# Patient Record
Sex: Male | Born: 1935
Health system: Southern US, Community
[De-identification: ages and names within clinical notes are randomized; demographics above are authoritative.]

## PROBLEM LIST (undated history)

## (undated) DIAGNOSIS — R918 Other nonspecific abnormal finding of lung field: Secondary | ICD-10-CM

## (undated) DIAGNOSIS — Z950 Presence of cardiac pacemaker: Secondary | ICD-10-CM

## (undated) DIAGNOSIS — C801 Malignant (primary) neoplasm, unspecified: Secondary | ICD-10-CM

## (undated) DIAGNOSIS — I441 Atrioventricular block, second degree: Secondary | ICD-10-CM

## (undated) DIAGNOSIS — I10 Essential (primary) hypertension: Secondary | ICD-10-CM

## (undated) DIAGNOSIS — R51 Headache: Secondary | ICD-10-CM

## (undated) HISTORY — PX: JOINT REPLACEMENT: SHX530

## (undated) HISTORY — DX: Other nonspecific abnormal finding of lung field: R91.8

## (undated) HISTORY — PX: HERNIA REPAIR: SHX51

## (undated) HISTORY — DX: Atrioventricular block, second degree: I44.1

## (undated) HISTORY — DX: Presence of cardiac pacemaker: Z95.0

---

## 2003-10-30 ENCOUNTER — Other Ambulatory Visit: Payer: Self-pay

## 2004-03-01 ENCOUNTER — Ambulatory Visit: Payer: Self-pay | Admitting: General Surgery

## 2005-02-14 HISTORY — PX: PROSTATECTOMY: SHX69

## 2006-06-08 ENCOUNTER — Ambulatory Visit: Payer: Self-pay | Admitting: Urology

## 2006-08-11 ENCOUNTER — Ambulatory Visit: Payer: Self-pay | Admitting: Urology

## 2006-08-15 ENCOUNTER — Ambulatory Visit: Payer: Self-pay | Admitting: Urology

## 2006-09-05 ENCOUNTER — Other Ambulatory Visit: Payer: Self-pay

## 2006-09-05 ENCOUNTER — Ambulatory Visit: Payer: Self-pay | Admitting: Urology

## 2006-09-19 ENCOUNTER — Inpatient Hospital Stay: Payer: Self-pay | Admitting: Urology

## 2007-02-15 HISTORY — PX: LEG AMPUTATION ABOVE KNEE: SHX117

## 2007-08-30 ENCOUNTER — Ambulatory Visit: Payer: Self-pay | Admitting: General Practice

## 2007-08-30 ENCOUNTER — Other Ambulatory Visit: Payer: Self-pay

## 2007-09-12 ENCOUNTER — Inpatient Hospital Stay: Payer: Self-pay | Admitting: General Practice

## 2007-09-17 ENCOUNTER — Other Ambulatory Visit: Payer: Self-pay

## 2007-09-21 ENCOUNTER — Encounter: Payer: Self-pay | Admitting: Internal Medicine

## 2007-09-29 ENCOUNTER — Inpatient Hospital Stay: Payer: Self-pay | Admitting: Vascular Surgery

## 2007-10-17 ENCOUNTER — Encounter: Payer: Self-pay | Admitting: Internal Medicine

## 2007-10-31 ENCOUNTER — Encounter: Payer: Self-pay | Admitting: Internal Medicine

## 2007-11-15 ENCOUNTER — Encounter: Payer: Self-pay | Admitting: Internal Medicine

## 2007-11-23 ENCOUNTER — Ambulatory Visit: Payer: Self-pay | Admitting: Podiatry

## 2007-12-04 ENCOUNTER — Ambulatory Visit: Payer: Self-pay | Admitting: Podiatry

## 2007-12-07 ENCOUNTER — Emergency Department: Payer: Self-pay | Admitting: Emergency Medicine

## 2007-12-13 ENCOUNTER — Inpatient Hospital Stay: Payer: Self-pay | Admitting: Vascular Surgery

## 2007-12-13 ENCOUNTER — Emergency Department: Payer: Self-pay | Admitting: Emergency Medicine

## 2008-01-10 ENCOUNTER — Encounter: Payer: Self-pay | Admitting: Internal Medicine

## 2008-01-18 ENCOUNTER — Ambulatory Visit: Payer: Self-pay | Admitting: Vascular Surgery

## 2008-01-30 ENCOUNTER — Inpatient Hospital Stay: Payer: Self-pay | Admitting: Vascular Surgery

## 2008-02-18 ENCOUNTER — Inpatient Hospital Stay: Payer: Self-pay | Admitting: Vascular Surgery

## 2008-02-18 ENCOUNTER — Ambulatory Visit: Payer: Self-pay | Admitting: Cardiovascular Disease

## 2008-02-22 ENCOUNTER — Ambulatory Visit: Payer: Self-pay | Admitting: Physical Medicine & Rehabilitation

## 2008-02-22 ENCOUNTER — Inpatient Hospital Stay (HOSPITAL_COMMUNITY)
Admission: RE | Admit: 2008-02-22 | Discharge: 2008-02-29 | Payer: Self-pay | Admitting: Physical Medicine & Rehabilitation

## 2008-03-27 ENCOUNTER — Encounter: Payer: Self-pay | Admitting: Vascular Surgery

## 2008-04-14 ENCOUNTER — Encounter: Payer: Self-pay | Admitting: Internal Medicine

## 2008-04-22 ENCOUNTER — Encounter
Admission: RE | Admit: 2008-04-22 | Discharge: 2008-05-09 | Payer: Self-pay | Admitting: Physical Medicine & Rehabilitation

## 2008-04-23 ENCOUNTER — Ambulatory Visit: Payer: Self-pay | Admitting: Physical Medicine & Rehabilitation

## 2008-04-23 ENCOUNTER — Encounter
Admission: RE | Admit: 2008-04-23 | Discharge: 2008-07-22 | Payer: Self-pay | Admitting: Physical Medicine & Rehabilitation

## 2008-05-15 ENCOUNTER — Encounter: Payer: Self-pay | Admitting: Internal Medicine

## 2008-06-14 ENCOUNTER — Encounter: Payer: Self-pay | Admitting: Internal Medicine

## 2008-07-15 ENCOUNTER — Encounter: Payer: Self-pay | Admitting: Internal Medicine

## 2008-08-14 ENCOUNTER — Encounter: Payer: Self-pay | Admitting: Internal Medicine

## 2008-09-14 ENCOUNTER — Encounter: Payer: Self-pay | Admitting: Internal Medicine

## 2008-10-15 ENCOUNTER — Encounter: Payer: Self-pay | Admitting: Internal Medicine

## 2008-11-14 ENCOUNTER — Encounter: Payer: Self-pay | Admitting: Internal Medicine

## 2008-12-15 ENCOUNTER — Encounter: Payer: Self-pay | Admitting: Internal Medicine

## 2009-02-19 ENCOUNTER — Ambulatory Visit: Payer: Self-pay

## 2009-03-20 ENCOUNTER — Encounter: Payer: Self-pay | Admitting: General Practice

## 2009-04-14 ENCOUNTER — Encounter: Payer: Self-pay | Admitting: General Practice

## 2009-05-15 ENCOUNTER — Encounter: Payer: Self-pay | Admitting: General Practice

## 2010-05-31 LAB — DIFFERENTIAL
Basophils Absolute: 0.1 10*3/uL (ref 0.0–0.1)
Eosinophils Relative: 5 % (ref 0–5)
Lymphocytes Relative: 19 % (ref 12–46)
Lymphs Abs: 1.3 10*3/uL (ref 0.7–4.0)
Neutro Abs: 4.4 10*3/uL (ref 1.7–7.7)

## 2010-05-31 LAB — COMPREHENSIVE METABOLIC PANEL
AST: 18 U/L (ref 0–37)
Albumin: 2.6 g/dL — ABNORMAL LOW (ref 3.5–5.2)
BUN: 12 mg/dL (ref 6–23)
Calcium: 9.3 mg/dL (ref 8.4–10.5)
Creatinine, Ser: 0.85 mg/dL (ref 0.4–1.5)
GFR calc Af Amer: >60 mL/min (ref >60)
GFR calc non Af Amer: >60 mL/min (ref >60)
Total Bilirubin: 0.6 mg/dL (ref 0.3–1.2)

## 2010-05-31 LAB — CBC
HCT: 30 % — ABNORMAL LOW (ref 39.0–52.0)
MCHC: 32.9 g/dL (ref 30.0–36.0)
MCV: 82.2 fL (ref 78.0–100.0)
Platelets: 499 10*3/uL — ABNORMAL HIGH (ref 150–400)

## 2010-06-29 NOTE — Assessment & Plan Note (Signed)
[name] is here after his right above-knee amputation.  He was  discharged home on February 29, 2008 from rehab and really has been  moving well.  He is not using any wheelchair at this point and amazingly  he is walking around with his walker, using the left leg, which  previously had a knee replacement.  He denies any problems with pain.  He is moving well and has been involved in outpatient therapies.  He has  had no problems with wound healing.  Left leg is holding up as well.  The patient wants to return to guarding and other activities outside the  home and inside the home.  It is little bit limited overall on his  activities in general due to his wife's condition.   REVIEW OF SYSTEMS:  Notable for the above.  Full review is in the  written health and history section.   SOCIAL HISTORY:  As noted above.  The patient is married.   PHYSICAL EXAMINATION:  VITAL SIGNS:  Blood pressure is 147/84, pulse is  91, respiratory rate 16, and sating 98% on room air.  GENERAL:  The patient is only pleasant, alert, and oriented x3.  He  comes in without a wheelchair today.  He stands unassisted with his  walker and actually he could stand on 1 leg without any balance at all.  He is able to dawn and doff his pants without assistance.  EXTREMITIES:  Right leg is well formed with stump shrinker in place.  The left leg has  5/5 strength.  Right leg has 4+-5/5 at the hip.  He has +10 hip  extension.  Upper extremity strength is 5/5.  HEART:  Regular.  CHEST:  Clear.  NEUROLOGIC:  Cognitively, he is appropriate.   ASSESSMENT:  1. Right above-knee amputation.  2. The patient has a K3 ambulator.   PLAN:  1. Review prosthetic options and felt that he would benefit from a      silicone suspension system with D-ring Velcro strap for rotation      prevention.  I will use a polycentric knee and a low profile      VariFlex foot to allow him weightbearing on uneven surfaces such as      his garden.  He  should do extremely well.  We will send to [facility] outpatient therapy once he is fitted with his leg by      [employer].      Ranelle Oyster, M.D.  Electronically Signed     ZTS/MedQ  D:  04/23/2008 11:49:14  T:  04/23/2008 23:55:16  Job #:  [id number]

## 2010-06-29 NOTE — Discharge Summary (Signed)
[name], [name]                   ACCOUNT NO.:  [id number]   MEDICAL RECORD NO.:  [id number]          PATIENT TYPE:  IPS   LOCATION:  4035                         FACILITY:  [facility]   PHYSICIAN:  Ellwood Dense, M.D.   DATE OF BIRTH:  1930-10-13   DATE OF ADMISSION:  02/22/2008  DATE OF DISCHARGE:  02/29/2008                               DISCHARGE SUMMARY   DISCHARGE DIAGNOSES:  1. Right above-knee amputation secondary to peripheral vascular      disease February 28, 2008.  2. Pain management.  3. Hypertension.  4. Hyperlipidemia.  5. Depression.  6. History of prostate cancer.  7. Anemia.  8. Subcutaneous Lovenox for deep vein thrombosis prophylaxis.   This is a 77 year old male, long history of peripheral vascular disease,  multi-revascularization procedures right lower extremity, who was  admitted to [facility] on February 18, 2008, with  ischemic changes to the right lower extremity and foul odor from  nonhealing ulcer.  No change with conservative care and underwent a  recent course of antibiotics with little change.  Underwent right above-  knee amputation on February 18, 2008, per Dr. Earl Lites at [facility]  Postoperative pain control with oxycodone.  Subcutaneous Lovenox for deep vein thrombosis prophylaxis.  Therapy  evaluation February 29, 2008, was max assist for supine to sit and  minimal assist sit to supine.  Gait was not tested.  The patient was  admitted for comprehensive rehab program.   PAST MEDICAL HISTORY:  See discharge diagnoses.  Remote smoker.  No  alcohol.   ALLERGIES:  ACE INHIBITORS and STATINS.   SOCIAL HISTORY:  Lives with wife.  Two-level home, 2 steps to entry.  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  Wife can also assist.   FUNCTIONAL HISTORY:  Prior to admission, he was independent with a  rolling walker.  Functional status  upon admission to rehab services, he  was minimal assist for basic mobility.   MEDICATIONS PRIOR TO ADMISSION:  1. Atenolol 25 mg daily.  2. Imipramine 25 mg 2 tablets daily.  3. Milk of magnesia as needed.   PHYSICAL EXAMINATION:  VITAL SIGNS:  Blood pressure 115/60, pulse 80,  temperature 98, and respirations 18.  GENERAL:  This is an alert male in no acute distress, oriented x3.  EXTREMITIES:  Deep tendon reflexes were hypoactive.  Sensation decreased  to light touch distally.  Mild redness with clips to right above-knee  amputation site.  No drainage.  Left calf was cool without any swelling,  erythema, nontender.  LUNGS:  Clear to auscultation.  CARDIAC:  Regular rate and rhythm.  ABDOMEN:  Soft and nontender.  Good bowel sounds.   REHABILITATION HOSPITAL COURSE:  The patient was admitted to inpatient  rehab services with therapies initiated on a 3-hour daily basis  consisting of physical therapy, occupational therapy, and 24-hour  rehabilitation nursing.  The following issues were addressed during the  patient's rehabilitation stay.  Pertaining to Mr. [name]  right above-  knee amputation secondary to peripheral vascular disease on February 18, 2008, surgical site healing nicely.  He had been fitted with a stump  shrinker per [employer].  He would follow up with Dr. Earl Lites at [facility] in the next 2 weeks for removal of staples.  A  plan was made at the [facility].  Pain  management ongoing with the use of oxycodone as needed for good results.  He remained on subcutaneous Lovenox for deep vein thrombosis prophylaxis  throughout his rehab course.  Blood pressures were well maintained on  atenolol with no orthostatic changes.  He remained on his imipramine for  history of depression with emotional support provided.  He did have a  history of prostate cancer.  There was no issues during his  rehabilitation stay and pertaining to his  bladder, he was voiding  without difficulties.  His bowel program had been well established.   Weekly interdisciplinary collaborative team conferences were held to  discuss the patient's estimated length of stays, ongoing family  teaching, any barriers to discharge.  He was attending full therapies,  independent for activities of daily living, sitting at sink side, steady  assistance to stand, needed some assistance for lower body self-care.  He was discharged to home with home therapies as advised per rehab  services.   DISCHARGE MEDICATIONS:  Discharge medications at the time of dictation  included:  1. Multivitamin daily.  2. Trinsicon 1 capsule twice daily.  3. Os-Cal 500 mg twice daily.  4. Atenolol 25 mg daily.  5. Lipitor 10 mg daily.  6. Imipramine 50 mg p.o. at bedtime.  7. Oxycodone immediate release 5 mg 1 or 2 tablets every 4 hours as      needed pain, dispense of 90 tablets.   The patient's subcutaneous Lovenox was discontinued at time of  discharge.  Diet was regular.   SPECIAL INSTRUCTIONS:  Follow up with Dr. Earl Lites at [facility] for removal of staples in the next 2 weeks.  Continue  stump shrinker as advised.  Dr. Thomasena Edis is to follow up in the  [facility]patient [facility], appointment to be made.  Follow up Dewaine Oats  ongoing medical management.      Mariam Dollar, P.A.    ______________________________  Ellwood Dense, M.D.   DA/MEDQ  D:  02/28/2008  T:  02/28/2008  Job:  [id number]   cc:   Dewaine Oats  Dr. Earl Lites

## 2010-06-29 NOTE — H&P (Signed)
NAMERENTON, BERKLEY                   ACCOUNT NO.:  [id number]   MEDICAL RECORD NO.:  [id number]          PATIENT TYPE:  IPS   LOCATION:  4035                         FACILITY:  [facility]   PHYSICIAN:  Ellwood Dense, M.D.   DATE OF BIRTH:  [birth year]   DATE OF ADMISSION:  02/22/2008  DATE OF DISCHARGE:                              HISTORY & PHYSICAL   PRIMARY CARE PHYSICIAN:  Dr. Arlana Pouch.   VASCULAR SURGEON:  Dr. Danne Harbor. Earl Lites.   HISTORY OF PRESENT ILLNESS:  Mr. Wetherington is a 77 year old Caucasian male  with a longstanding history of peripheral vascular disease with multiple  revascularization procedures, the most recent in September and October,  2009 on his right lower extremity.  The patient also has a history of a  right total knee replacement in July, 2009.  Which healed well  postoperatively.   The patient was admitted to [facility] on February 18, 2008 with ischemic changes in the right lower extremity with foul  odor coming from a nonhealing ulcer.  He had failed conservative care  including recent course of antibiotics.   The patient underwent a right above-knee amputation, February 18, 2008 by  Dr. Earl Lites Dr. Mayford Knife.  Postoperative pain control was with oxycodone  on an as-needed basis.  Subcutaneous Lovenox was added for DVT  prophylaxis.  Therapy eval February 19, 2008 showed the patient requiring  max assist for supine to sit and min assist sit to supine.  Gait was not  tested.   The patient was evaluated by the rehabilitation physicians and felt to  be an appropriate candidate for inpatient rehabilitation.   REVIEW OF SYSTEMS:  Positive for depression, reflux and weakness.   PAST MEDICAL HISTORY:  1. History of peripheral vascular disease with multiple      revascularization procedures.  2. Dyslipidemia.  3. Hypertension.  4. History of prostate cancer, status post radical prostatectomy 7      years, probably greater than 1 year ago.  5.  Depression.  6. Constipation.  7. Right total knee replacement, July of 2009.  8. Left total knee replacement 6 years ago.   FAMILY HISTORY:  Positive for coronary artery disease.   SOCIAL HISTORY:  The patient lives with his wife and an adult daughter  in her 40s who has suffered brain injury as a result of an accident in  her 39s.  She does need some assistance at home.  The the patient's wife  has also had some cardiac medical problems in the past.  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  His wife can reportedly give some assist at  discharge.   FUNCTIONAL HISTORY PRIOR TO ADMISSION:  Independent using a rolling  walker and completely independent prior to July of 2009.   FUNCTIONAL STATUS AT PRESENT:  Min assist overall.   ALLERGIES:  ACE INHIBITORS AND STATINS.   MEDICATIONS PRIOR TO ADMISSION:  1. Lipitor 10 mg daily.  2. Atenolol 25 mg daily.  3. Milk of  magnesia p.r.n.  4. Imipramine 25 mg two tablets daily   LABORATORY:  Recent hemoglobin was 9.1 with hematocrit of 27, platelet  count of 370,000, white count of 6.7.  They are no electrolytes noted.   PHYSICAL EXAMINATION:  A well-appearing elderly adult male lying in bed  in mild to no acute discomfort.  Blood pressure 115/60 with pulse of 80, respiratory rate 18 and  temperature 98.  HEENT:  Normocephalic, nontraumatic artifacts was  CARDIOVASCULAR:  Regular rate and rhythm.  S1 and S2 without murmurs.  ABDOMEN:  Soft, slightly obese, nontender with positive bowel sounds.  LUNGS:  Clear to auscultation bilaterally.  NEUROLOGIC:  Alert and oriented x3.  Cranial nerves II-XII are intact.  BILATERAL UPPER EXTREMITY EXAM:  Showed 4/5 strength throughout.  Bulk  and tone were normal and reflexes 2+ and symmetrical.  Sensation was  intact to light touch throughout the bilateral upper extremities.  EXAMINATION OF HIS ABOVE-KNEE AMPUTATION SITE:  Shows  excellent healing  without drainage.  Staples are intact.  He has a Kerlix along with Ace  wrap on that incision/leg.  He has hip flexion of 4-/5 on the right.  LEFT LOWER EXTREMITY EXAM:  Showed normal bulk and tone throughout.  There were no open areas.  No areas of wounds.  Strength was generally  4/5 in the left lower extremity.   DIAGNOSES:  Status post right above-knee amputation secondary to chronic  peripheral vascular disease with history of multiple revascularization  procedures in the past, along with partial foot amputation.   1. Presently the patient is admitted to receive collaborative and      interdisciplinary care between the physiatrist, rehab nursing staff      and therapy team.  2. The patient's level of medical complexity and substantial therapy      needs in context of that medical necessity cannot be provided at      lesser intensity of care.  The patient has experienced substantial      functional loss from his baseline.  Judging by the patient's      diagnosis, physical exam and functional history it is hoped with      aggressive therapies that the patient will be able to make      measurable improvements in functional status to be able to return      home with his wife.  The physiatrist will provide 24-hour      management of medical needs as well as oversight of the therapy      plan/treatment and provide guidance as appropriate regarding      interaction of the two.  3. A 24-hour rehab nursing will assist in management of bowel and      bladder and help integrate therapy concepts, techniques and      education.  4. Physical therapy will assess and treat for range of motion,      strengthening, bed mobility, transfers, pregait training, gait      training and equipment eval with goals of modified independent      transfers and modified independent wheelchair mobility with standby      assist for ambulation short distances.  5. Occupational therapy will  assess and treat for range of motion,      strengthening, ADLs, cognitive/perceptional training, splinting and      equipment eval with goals of modified independent upper extremity      ADLs and standby assist lower extremity ADLs.  6. The case management  and social worker will assess and treat for      psychosocial issues and discharge planning as appropriate.  Team      conferences will be held weekly to establish goals, assess progress      and determine barriers to discharge.  The patient has demonstrated      sufficient medical stability and exercise capacity to tolerate at      least 3 hours of therapy per day at least 5 days per week.  7. Estimated length of stay:  10-18 days.   PROGNOSIS:  Good.   MEDICAL PROBLEM LIST:  1. Hypertension, presently maintained on atenolol.  2. Dyslipidemia with continuation of Lipitor.  3. Depression with continuation of imipramine.  4. Postoperative anemia with continuation of iron.  5. Subcutaneous Lovenox for DVT prophylaxis.  6. Pain control with p.r.n. oxycodone immediate release.  7. Referral to [employer] for future prosthesis.           ______________________________  Ellwood Dense, M.D.     DC/MEDQ  D:  02/22/2008  T:  02/22/2008  Job:  [id number]

## 2011-12-16 HISTORY — PX: INSERT / REPLACE / REMOVE PACEMAKER: SUR710

## 2011-12-20 ENCOUNTER — Encounter (HOSPITAL_COMMUNITY): Payer: Self-pay | Admitting: *Deleted

## 2011-12-20 ENCOUNTER — Inpatient Hospital Stay (HOSPITAL_COMMUNITY)
Admission: AD | Admit: 2011-12-20 | Discharge: 2011-12-23 | DRG: 243 | Disposition: A | Discharge status: Skilled Nursing Facility | Payer: Medicare Other | Source: Other Acute Inpatient Hospital | Attending: Internal Medicine | Admitting: Internal Medicine

## 2011-12-20 ENCOUNTER — Emergency Department: Payer: Self-pay | Admitting: Emergency Medicine

## 2011-12-20 DIAGNOSIS — I4430 Unspecified atrioventricular block: Secondary | ICD-10-CM

## 2011-12-20 DIAGNOSIS — Z8546 Personal history of malignant neoplasm of prostate: Secondary | ICD-10-CM

## 2011-12-20 DIAGNOSIS — K8020 Calculus of gallbladder without cholecystitis without obstruction: Secondary | ICD-10-CM | POA: Diagnosis present

## 2011-12-20 DIAGNOSIS — S78119A Complete traumatic amputation at level between unspecified hip and knee, initial encounter: Secondary | ICD-10-CM

## 2011-12-20 DIAGNOSIS — Z7982 Long term (current) use of aspirin: Secondary | ICD-10-CM

## 2011-12-20 DIAGNOSIS — Z87891 Personal history of nicotine dependence: Secondary | ICD-10-CM

## 2011-12-20 DIAGNOSIS — I2510 Atherosclerotic heart disease of native coronary artery without angina pectoris: Secondary | ICD-10-CM | POA: Diagnosis present

## 2011-12-20 DIAGNOSIS — I441 Atrioventricular block, second degree: Principal | ICD-10-CM | POA: Diagnosis present

## 2011-12-20 DIAGNOSIS — I442 Atrioventricular block, complete: Secondary | ICD-10-CM

## 2011-12-20 DIAGNOSIS — Z96659 Presence of unspecified artificial knee joint: Secondary | ICD-10-CM

## 2011-12-20 DIAGNOSIS — Z79899 Other long term (current) drug therapy: Secondary | ICD-10-CM

## 2011-12-20 DIAGNOSIS — I10 Essential (primary) hypertension: Secondary | ICD-10-CM

## 2011-12-20 DIAGNOSIS — R911 Solitary pulmonary nodule: Secondary | ICD-10-CM | POA: Diagnosis present

## 2011-12-20 DIAGNOSIS — Z23 Encounter for immunization: Secondary | ICD-10-CM

## 2011-12-20 DIAGNOSIS — R55 Syncope and collapse: Secondary | ICD-10-CM

## 2011-12-20 DIAGNOSIS — E785 Hyperlipidemia, unspecified: Secondary | ICD-10-CM | POA: Diagnosis present

## 2011-12-20 DIAGNOSIS — Z8614 Personal history of Methicillin resistant Staphylococcus aureus infection: Secondary | ICD-10-CM

## 2011-12-20 DIAGNOSIS — R918 Other nonspecific abnormal finding of lung field: Secondary | ICD-10-CM

## 2011-12-20 HISTORY — DX: Malignant (primary) neoplasm, unspecified: C80.1

## 2011-12-20 HISTORY — DX: Headache: R51

## 2011-12-20 HISTORY — DX: Essential (primary) hypertension: I10

## 2011-12-20 LAB — CBC
HCT: 47.9 % (ref 40.0–52.0)
HCT: 45.2 % (ref 39.0–52.0)
Hemoglobin: 15.6 g/dL (ref 13.0–17.0)
MCH: 33 pg (ref 26.0–34.0)
MCHC: 34.5 g/dL (ref 30.0–36.0)
RBC: 4.87 MIL/uL (ref 4.22–5.81)
RDW: 13.1 % (ref 11.5–14.5)
WBC: 8.2 10*3/uL (ref 3.8–10.6)

## 2011-12-20 LAB — TROPONIN I: Troponin-I: <0.02 ng/mL

## 2011-12-20 LAB — URINALYSIS, COMPLETE
Bilirubin,UR: NEGATIVE
Glucose,UR: NEGATIVE mg/dL (ref 0–75)
Hyaline Cast: 7
Ketone: NEGATIVE
RBC,UR: 1 /HPF (ref 0–5)
Squamous Epithelial: NONE SEEN
WBC UR: 1 /HPF (ref 0–5)

## 2011-12-20 LAB — COMPREHENSIVE METABOLIC PANEL
BUN: 18 mg/dL (ref 7–18)
Calcium, Total: 9.5 mg/dL (ref 8.5–10.1)
Chloride: 105 mmol/L (ref 98–107)
Co2: 28 mmol/L (ref 21–32)
Creatinine: 0.98 mg/dL (ref 0.60–1.30)
EGFR (African American): >60
EGFR (Non-African Amer.): >60
Osmolality: 284 (ref 275–301)
SGOT(AST): 16 U/L (ref 15–37)
SGPT (ALT): 33 U/L (ref 12–78)
Total Protein: 7.3 g/dL (ref 6.4–8.2)

## 2011-12-20 LAB — MAGNESIUM: Magnesium: 2.2 mg/dL

## 2011-12-20 LAB — PROTIME-INR: Prothrombin Time: 13.6 seconds (ref 11.6–15.2)

## 2011-12-20 MED ORDER — ATORVASTATIN CALCIUM 20 MG PO TABS
20.0000 mg | ORAL_TABLET | Freq: Every evening | ORAL | Status: DC
  Administered 2011-12-22: 20 mg via ORAL
  Filled 2011-12-20 (×4): qty 1

## 2011-12-20 MED ORDER — SODIUM CHLORIDE 0.9 % IJ SOLN
3.0000 mL | Freq: Two times a day (BID) | INTRAMUSCULAR | Status: DC
  Administered 2011-12-20 – 2011-12-21 (×2): 3 mL via INTRAVENOUS

## 2011-12-20 MED ORDER — SODIUM CHLORIDE 0.9 % IV SOLN: 250.0000 mL | INTRAVENOUS | Status: DC | PRN

## 2011-12-20 MED ORDER — ASPIRIN EC 81 MG PO TBEC
81.0000 mg | DELAYED_RELEASE_TABLET | Freq: Every day | ORAL | Status: DC
  Administered 2011-12-21 – 2011-12-23 (×3): 81 mg via ORAL
  Filled 2011-12-20 (×3): qty 1

## 2011-12-20 MED ORDER — HEPARIN SODIUM (PORCINE) 5000 UNIT/ML IJ SOLN
5000.0000 [IU] | Freq: Three times a day (TID) | INTRAMUSCULAR | Status: DC
  Administered 2011-12-20 – 2011-12-21 (×2): 5000 [IU] via SUBCUTANEOUS
  Filled 2011-12-20 (×5): qty 1

## 2011-12-20 MED ORDER — IMIPRAMINE HCL 50 MG PO TABS
50.0000 mg | ORAL_TABLET | Freq: Every morning | ORAL | Status: DC
  Administered 2011-12-21 – 2011-12-23 (×3): 50 mg via ORAL
  Filled 2011-12-20 (×3): qty 1

## 2011-12-20 MED ORDER — SODIUM CHLORIDE 0.9 % IJ SOLN: 3.0000 mL | INTRAMUSCULAR | Status: DC | PRN
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
## 2011-12-21 ENCOUNTER — Inpatient Hospital Stay (HOSPITAL_COMMUNITY): Payer: Medicare Other

## 2011-12-21 ENCOUNTER — Encounter (HOSPITAL_COMMUNITY)
Admission: AD | Disposition: A | Discharge status: Skilled Nursing Facility | Payer: Self-pay | Source: Other Acute Inpatient Hospital | Attending: Internal Medicine

## 2011-12-21 DIAGNOSIS — I441 Atrioventricular block, second degree: Secondary | ICD-10-CM

## 2011-12-21 DIAGNOSIS — R55 Syncope and collapse: Secondary | ICD-10-CM

## 2011-12-21 DIAGNOSIS — I442 Atrioventricular block, complete: Secondary | ICD-10-CM

## 2011-12-21 HISTORY — PX: PERMANENT PACEMAKER INSERTION: SHX5480

## 2011-12-21 HISTORY — DX: Atrioventricular block, second degree: I44.1

## 2011-12-21 LAB — COMPREHENSIVE METABOLIC PANEL
ALT: 20 U/L (ref 0–53)
Alkaline Phosphatase: 49 U/L (ref 39–117)
BUN: 16 mg/dL (ref 6–23)
CO2: 24 mEq/L (ref 19–32)
Chloride: 105 mEq/L (ref 96–112)
GFR calc Af Amer: >90 mL/min (ref >90)
GFR calc non Af Amer: 81 mL/min — ABNORMAL LOW (ref >90)
Glucose, Bld: 103 mg/dL — ABNORMAL HIGH (ref 70–99)
Potassium: 3.8 mEq/L (ref 3.5–5.1)
Sodium: 140 mEq/L (ref 135–145)
Total Bilirubin: 0.7 mg/dL (ref 0.3–1.2)
Total Protein: 6.2 g/dL (ref 6.0–8.3)

## 2011-12-21 LAB — BASIC METABOLIC PANEL
BUN: 16 mg/dL (ref 6–23)
CO2: 26 mEq/L (ref 19–32)
Calcium: 9.2 mg/dL (ref 8.4–10.5)
Creatinine, Ser: 0.94 mg/dL (ref 0.50–1.35)
GFR calc non Af Amer: 77 mL/min — ABNORMAL LOW (ref >90)
Glucose, Bld: 97 mg/dL (ref 70–99)
Sodium: 139 mEq/L (ref 135–145)

## 2011-12-21 LAB — MRSA PCR SCREENING: MRSA by PCR: NEGATIVE

## 2011-12-21 LAB — CBC
MCH: 32.9 pg (ref 26.0–34.0)
MCHC: 35.1 g/dL (ref 30.0–36.0)
MCV: 93.7 fL (ref 78.0–100.0)
Platelets: 172 10*3/uL (ref 150–400)
RBC: 4.74 MIL/uL (ref 4.22–5.81)

## 2011-12-21 LAB — PROTIME-INR
INR: 1.07 (ref 0.00–1.49)
Prothrombin Time: 13.8 seconds (ref 11.6–15.2)

## 2011-12-21 SURGERY — PERMANENT PACEMAKER INSERTION
Anesthesia: LOCAL

## 2011-12-21 MED ORDER — ACETAMINOPHEN 325 MG PO TABS
325.0000 mg | ORAL_TABLET | ORAL | Status: DC | PRN
  Administered 2011-12-23: 650 mg via ORAL
  Filled 2011-12-21: qty 2

## 2011-12-21 MED ORDER — PHENYLEPHRINE HCL 10 MG/ML IJ SOLN
INTRAMUSCULAR | Status: AC
  Filled 2011-12-21: qty 1

## 2011-12-21 MED ORDER — CEFAZOLIN SODIUM 1-5 GM-% IV SOLN
1.0000 g | Freq: Four times a day (QID) | INTRAVENOUS | Status: AC
  Administered 2011-12-21 – 2011-12-22 (×3): 1 g via INTRAVENOUS
  Filled 2011-12-21 (×3): qty 50

## 2011-12-21 MED ORDER — HYDROCODONE-ACETAMINOPHEN 5-325 MG PO TABS
1.0000 | ORAL_TABLET | ORAL | Status: DC | PRN
  Administered 2011-12-21 – 2011-12-22 (×6): 1 via ORAL
  Filled 2011-12-21: qty 2
  Filled 2011-12-21 (×4): qty 1

## 2011-12-21 MED ORDER — ONDANSETRON HCL 4 MG/2ML IJ SOLN: 4.0000 mg | Freq: Four times a day (QID) | INTRAMUSCULAR | Status: DC | PRN

## 2011-12-21 MED ORDER — CHLORHEXIDINE GLUCONATE 4 % EX LIQD
60.0000 mL | Freq: Once | CUTANEOUS | Status: AC
  Administered 2011-12-21: 4 via TOPICAL
  Filled 2011-12-21: qty 45
  Filled 2011-12-21: qty 15

## 2011-12-21 MED ORDER — SODIUM CHLORIDE 0.9 % IR SOLN
80.0000 mg | Status: DC
  Filled 2011-12-21 (×2): qty 2

## 2011-12-21 MED ORDER — CEFAZOLIN SODIUM-DEXTROSE 2-3 GM-% IV SOLR
2.0000 g | INTRAVENOUS | Status: DC
  Filled 2011-12-21: qty 50

## 2011-12-21 MED ORDER — SODIUM CHLORIDE 0.45 % IV SOLN
INTRAVENOUS | Status: DC
  Administered 2011-12-21: 50 mL/h via INTRAVENOUS

## 2011-12-21 MED ORDER — CHLORHEXIDINE GLUCONATE 4 % EX LIQD
60.0000 mL | Freq: Once | CUTANEOUS | Status: AC
  Administered 2011-12-21: 4 via TOPICAL
  Filled 2011-12-21: qty 60

## 2011-12-21 NOTE — Progress Notes (Signed)
Pt transferred by RN on monitor for PA & lat CXR.  Pt remained stable during CXR and transfer;

## 2011-12-21 NOTE — Consult Note (Signed)
ELECTROPHYSIOLOGY CONSULT NOTE    Patient ID: [name] MRN: [id number], DOB/AGE: 76-Sep-1932 76 y.o.  Admit date: 12/20/2011 Date of Consult: 12-21-2011  Reason for Consultation: intermittent complete heart block  HPI:  [name] is a 78 year old male with a history of hypertension (treated with Atenolol), prostate cancer in 2007 (s/p prostatectomy), and bilateral knee replacements complicated by MRSA infection s/p right AKA.    He denies chest pain or shortness of breath at baseline and has had no prior history of syncope.  On 11-4, he was eating breakfast at [employer] when he passed out. A woman came over and started shaking him, he woke up, and drove himself home.  He continued to have episodes of syncope and home and so presented to [facility] ER for evaluation.  There, he had another episode of syncope with CHB demonstrated on the monitor.  He was transferred to [facility] for further evaluation.  Atenolol has been allowed to wash out. Presently, he denies CP, SOB, or any other concerns.  EP has been asked to evaluate for pacemaker placement.    Past Medical History  Diagnosis Date  . Hypertension   . Cancer     prostate 2007  . Headache     migraines w/o HA; auras     Surgical History:  Past Surgical History  Procedure Date  . Joint replacement     both R & L replacement;  then R AKA  . Prostatectomy 2007  . Leg amputation above knee 2009    R leg  . Hernia repair     inguinal hernia     Prescriptions prior to admission  Medication Sig Dispense Refill  . aspirin EC 81 MG tablet Take 81 mg by mouth daily.      Marland Kitchen atenolol (TENORMIN) 25 MG tablet Take 25 mg by mouth daily.      Marland Kitchen atorvastatin (LIPITOR) 20 MG tablet Take 20 mg by mouth every evening.      Marland Kitchen imipramine (TOFRANIL) 25 MG tablet Take 50 mg by mouth every morning.      . Multiple Vitamin (MULTIVITAMIN WITH MINERALS) TABS Take 1 tablet by mouth 2 (two) times daily.        Inpatient Medications:    . aspirin  EC  81 mg Oral Daily  . atorvastatin  20 mg Oral QPM  . heparin  5,000 Units Subcutaneous Q8H  . imipramine  50 mg Oral q morning - 10a    Allergies: No Known Allergies  History   Social History  . Marital Status: Married    Spouse Name: N/A    Number of Children: N/A  . Years of Education: N/A   Social History Main Topics  . Smoking status: Former Smoker    Types: Cigarettes    Quit date: 02/14/1978  . Smokeless tobacco: Former Neurosurgeon    Quit date: 02/14/1978  . Alcohol Use: No  . Drug Use: No  . Sexually Active:    Physical Exam: Filed Vitals:   12/21/11 1000 12/21/11 1100 12/21/11 1157 12/21/11 1200  BP: 137/76 126/64  175/83  Pulse: 83 71 83 84  Temp:    97.9 F (36.6 C)  TempSrc:    Oral  Resp: 21 23 8 25   Height:      Weight:      SpO2: 93% 95% 100% 98%    GEN- The patient is well appearing, alert and oriented x 3 today.   Head- normocephalic, atraumatic Eyes-  Sclera clear, conjunctiva pink Ears- hearing intact Oropharynx- clear Neck- supple Lungs- Clear to ausculation bilaterally, normal work of breathing Heart- Regular rate and rhythm, no murmurs, rubs or gallops, PMI not laterally displaced GI- soft, NT, ND, + BS Extremities- no clubbing, cyanosis, or edema, s/p R BKA  Labs:   Lab Results  Component Value Date   WBC 8.4 12/21/2011   HGB 15.6 12/21/2011   HCT 44.4 12/21/2011   MCV 93.7 12/21/2011   PLT 172 12/21/2011    Lab 12/21/11 0540 12/20/11 2311  NA 139 --  K 3.8 --  CL 103 --  CO2 26 --  BUN 16 --  CREATININE 0.94 --  CALCIUM 9.2 --  PROT -- 6.2  BILITOT -- 0.7  ALKPHOS -- 49  ALT -- 20  AST -- 19  GLUCOSE 97 --    Radiology/Studies: X-ray Chest Pa And Lateral  12/21/2011  *RADIOLOGY REPORT*  Clinical Data: Pre pacemaker radiograph  CHEST - 2 VIEW  Comparison: None.  Findings: Prominent aortic contour with mild tortuosity.  Heart size within normal limits.  Eventration of the hemidiaphragms. There are bilateral pulmonary nodules,  including a cluster of nodules within the left mid lung.  The density is high which may reflect calcification.  A spinal stimulator projects over the T10 vertebral body.  No pleural effusion or pneumothorax.  No acute osseous finding.  IMPRESSION: Bilateral pulmonary nodules.  I have no prior study to evaluate for stability, therefore recommend CT if this is an unknown finding.   Original Report Authenticated By: Jearld Lesch, M.D.    WUJ:WJXBJ rhythm with RBBB, EMS strip shows SR with LBBB  TELEMETRY: sinus rhythm with RBBB, telemetry from [facility] demonstrates period of CHB, pt CHB and with 20 second RR interval this am  ECHO: pending  Assessment and Plan: 1. Mobitz II second degree AV block The patient has symptomatic AV block with advanced conduction disease.  He continues to have transient complete heart block with prolonged RR intervals (up to 20 seconds) despite having washed out his atenolol.  There are no other reversible causes.  I would therefore recommend pacemaker implantation at this time.  Risks, benefits, alternatives to pacemaker implantation were discussed in detail with the patient today. The patient understands that the risks include but are not limited to bleeding, infection, pneumothorax, perforation, tamponade, vascular damage, renal failure, MI, stroke, death,  and lead dislodgement and wishes to proceed. We will therefore schedule the procedure at this time.  2. 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  He is felt to possibly have mesocardia though dextracardia is felt to be unlikely.  Per Dr Eden Emms, no further workup is advised presently prior to [facility] implant.  I have informed the patient of these findings.  He and his family are aware that this may increase risks associated with the procedure.  Fayrene Fearing Hershall Benkert,MD

## 2011-12-21 NOTE — H&P (Signed)
See my consult note from today

## 2011-12-21 NOTE — Progress Notes (Signed)
Chaplain visited patient after he was referred by the secretary. Chaplain found patient responsive and alert. Patient has just regained his conscious after passing out twice. Patient's son, daughter in-law and pastor were present during my visit. Chaplain shared words of hope and encouragement with family, friend and patient. Chaplain also empathetically listened to patient as he narrates his episodes. Chaplain provided ministry of presence and will continue to provide spiritual care and support to patient as needed. Family and patient thanked the Chaplain and expressed their appreciation for the visit.

## 2011-12-21 NOTE — Progress Notes (Signed)
Patient had 20 second pause. Patient went unresponsive, but spontaneously returned. Zoll pads reapplied, MD paged; orders given to prepare patient for pacemaker and continue to monitor.

## 2011-12-21 NOTE — Progress Notes (Signed)
  Echocardiogram 2D Echocardiogram with Bubble study has been performed.  [name] FRANCES 12/21/2011, 2:16 PM

## 2011-12-21 NOTE — Op Note (Signed)
SURGEON:  Hillis Range, MD     PREPROCEDURE DIAGNOSIS:  Recurrent symptomatic mobitz II second degree AV block with transient ventricular standstill and syncope    POSTPROCEDURE DIAGNOSIS:  Recurrent symptomatic mobitz II second degree AV block with transient ventricular standstill and syncope     PROCEDURES:   1. Left upper extremity venography.   2. Pacemaker implantation.     INTRODUCTION: [name] is a 78 y.o. male  with a history of recurrent symptomatic mobitz II second degree AV block with transient ventricular standstill and syncope who presents today for pacemaker implantation.  The patient reports intermittent episodes of syncope over the past two days.  His prior atenolol has been held and washed out but continues to have frequent ventricular standstill with syncope.  No reversible causes have been identified.  The patient therefore presents today for pacemaker implantation.     DESCRIPTION OF PROCEDURE:  Informed written consent was obtained, and the patient was brought to the electrophysiology lab in a fasting state.  The patient required no sedation for the procedure today.  The patients left chest was prepped and draped in the usual sterile fashion by the EP lab staff. The skin overlying the left deltopectoral region was infiltrated with lidocaine for local analgesia.  A 4-cm incision was made over the left deltopectoral region.  A left subcutaneous pacemaker pocket was fashioned using a combination of sharp and blunt dissection. Electrocautery was required to assure hemostasis.    Left Upper Extremity Venography: A venogram of the left upper extremity was performed, which revealed a large left axillary vein, which emptied into a large left subclavian vein.  The left cephalic vein was moderate in size.    RA/RV Lead Placement: The left axillary vein was therefore cannulated.  Through the left axillary vein, a [employer] model 780 [id number] [id number] (serial number [id number]) right atrial  lead and a [employer] model 1948- 58 (serial number [id number]) right ventricular lead were advanced with fluoroscopic visualization into the right atrial appendage and right ventricular apex positions respectively.  Initial atrial lead P- waves measured 2.7 mV with impedance of 484 ohms and a threshold of 1.9 V at 0.5 msec.  Right ventricular lead R-waves measured 5 mV with an impedance of 1087 ohms and a threshold of 0.4 V at 0.5 msec.  Both leads were secured to the pectoralis fascia using #2-0 silk over the suture sleeves.   Device Placement:  The leads were then connected to a [employer] Accent DR RF model [id number] (serial number [id number]) pacemaker.  The pocket was irrigated with copious gentamicin solution.  The pacemaker was then placed into the pocket.  The pocket was then closed in 2 layers with 2.0 Vicryl suture for the subcutaneous and subcuticular layers.  Steri-Strips and a sterile dressing were then applied.  There were no early apparent complications.     CONCLUSIONS:   1. Successful implantation of a Industrial/product designer DR RF dual-chamber pacemaker for recurrent symptomatic mobitz II second degree AV block with transient ventricular standstill and syncope  2. No early apparent complications.           Hillis Range, MD 12/21/2011 4:29 PM

## 2011-12-22 ENCOUNTER — Encounter: Payer: Self-pay | Admitting: *Deleted

## 2011-12-22 ENCOUNTER — Inpatient Hospital Stay (HOSPITAL_COMMUNITY): Payer: Medicare Other

## 2011-12-22 DIAGNOSIS — Z950 Presence of cardiac pacemaker: Secondary | ICD-10-CM | POA: Insufficient documentation

## 2011-12-22 HISTORY — DX: Presence of cardiac pacemaker: Z95.0

## 2011-12-22 LAB — T4, FREE: Free T4: 1.42 ng/dL (ref 0.80–1.80)

## 2011-12-22 NOTE — Progress Notes (Signed)
Pt transferred to room 3038 via bed with belongings (prostethic leg, green duffle bag, toiletries, and black slacks). Report given to receiving nurse, Carollee Herter, RN. Pt has no complaints at this time. Call light placed in pt's hand and pt oriented to new room.   Dawson Bills, RN

## 2011-12-22 NOTE — Progress Notes (Signed)
   ELECTROPHYSIOLOGY ROUNDING NOTE    Patient Name: [name] Date of Encounter: 12-22-2011    SUBJECTIVE:Patient feels well.  No chest pain or shortness of breath.  Minimal incisional soreness.  S/p PPM implant 12-21-2011 for intermittent CHB.  Pt ambulates with prosthesis and a cane using his left arm.  Anxious about going home, states he would prefer to stay one more day since he lives so far away.  TELEMETRY: Reviewed telemetry pt in sinus rhythm with frequent PVCs Filed Vitals:   12/21/11 2200 12/21/11 2300 12/22/11 0000 12/22/11 0100  BP: 122/55 111/66  116/55  Pulse: 77 91 89 85  Temp:   98 F (36.7 C)   TempSrc:   Oral   Resp: 28 19 23 20   Height:      Weight:      SpO2: 92% 96% 94% 93%    Intake/Output Summary (Last 24 hours) at 12/22/11 1610 Last data filed at 12/21/11 2321  Gross per 24 hour  Intake    745 ml  Output    800 ml  Net    -55 ml    Physical Exam:  GEN- The patient is well appearing, alert and oriented x 3 today.   Head- normocephalic, atraumatic Eyes-  Sclera clear, conjunctiva pink Ears- hearing intact Oropharynx- clear Neck- supple,   Lungs- Clear to ausculation bilaterally, normal work of breathing Chest- pacemaker pocket is without hematoma  CV- RRR GI- soft, NT, ND, + BS Extremities- no clubbing, cyanosis, or edema, s/p BKA  Pacemaker interrogation- reviewed in detail today,  See Paper chart  LABS: Basic Metabolic Panel:  Basename 12/21/11 0540 12/20/11 2311  NA 139 140  K 3.8 3.8  CL 103 105  CO2 26 24  GLUCOSE 97 103*  BUN 16 16  CREATININE 0.94 0.85  CALCIUM 9.2 9.5  MG -- --  PHOS -- --   Liver Function Tests:  [facility] 12/20/11 2311  AST 19  ALT 20  ALKPHOS 49  BILITOT 0.7  PROT 6.2  ALBUMIN 3.6   CBC:  Basename 12/21/11 0540 12/20/11 2311  WBC 8.4 9.6  NEUTROABS -- --  HGB 15.6 15.6  HCT 44.4 45.2  MCV 93.7 92.8  PLT 172 171    Thyroid Function Tests:  Basename 12/21/11 1300  TSH 1.701  T4TOTAL  --  T3FREE --  THYROIDAB --    Radiology/Studies:  No ptx, stable leads  Active Problems:  Second degree Mobitz II AV block   Assessment and Plan:  1. Mobitz II Second degree AV block Doing well s/p PPM CXR and interrogation are reviewed Transfer to telemetry  2. Pulmonary nodules Will obtain CT as recommended by radiology, patient is aware  3. BKA Will ask PT to ambulate and assist with weight bearing instructions with new PPM  To telemetry today Hopefully home tomorrow

## 2011-12-22 NOTE — Progress Notes (Signed)
Report received from raidiology on CT scan. Paged Dr. Johney Frame whom saw the patient today.

## 2011-12-22 NOTE — Progress Notes (Signed)
Transferred down to xray with RN on monitor; no complications

## 2011-12-23 ENCOUNTER — Telehealth: Payer: Self-pay

## 2011-12-23 ENCOUNTER — Encounter: Payer: Self-pay | Admitting: Internal Medicine

## 2011-12-23 DIAGNOSIS — R918 Other nonspecific abnormal finding of lung field: Secondary | ICD-10-CM

## 2011-12-23 DIAGNOSIS — I10 Essential (primary) hypertension: Secondary | ICD-10-CM

## 2011-12-23 MED ORDER — HYDROCODONE-ACETAMINOPHEN 5-325 MG PO TABS: 1.0000 | ORAL_TABLET | ORAL | Status: DC | PRN

## 2011-12-23 NOTE — Telephone Encounter (Signed)
I received T/C from [place], [place] with [facility]. He says pt is being d/c today. He also says he has spoken with Dr. Arlana Pouch about PET scan and he thinks Dr. Arlana Pouch will be handling this.  I will call Dr. Maree Krabbe office to confirm.

## 2011-12-23 NOTE — Telephone Encounter (Signed)
I spoke with Dr. Maree Krabbe office and Dr. Arlana Pouch confirmed he will order PET CT.  We do not need to order this.

## 2011-12-23 NOTE — Progress Notes (Signed)
D/c orders received;IV removed with gauze on, pt remains instable condition, pt meds and instructions reviewed and given to pt; reviewed limited arm mvts with pt; pt d/c to SNF, taken by son

## 2011-12-23 NOTE — Telephone Encounter (Signed)
"  Schedule pt for PET scan of chest, abdomen and pelvis to further evaluate lung nodules found on CXR and CT. Schedule at [facility]." VO Dr. Alvis Lemmings, RN  Pt still inpt at [facility]. Will await d/c and call pt with appt

## 2011-12-23 NOTE — Clinical Social Work Psychosocial (Signed)
     Clinical Social Work Department BRIEF PSYCHOSOCIAL ASSESSMENT 12/23/2011  Patient:  [name], [name]     Account Number:  [id number]     Admit date:  12/20/2011  Clinical Social Worker:  Margaree Mackintosh  Date/Time:  12/23/2011 01:38 PM  Referred by:  Physician  Date Referred:  12/23/2011 Referred for  SNF Placement   Other Referral:   Interview type:  Family Other interview type:   Patient currently resting.    PSYCHOSOCIAL DATA Living Status:  FAMILY Admitted from facility:   Level of care:   Primary support name:  MaryEvelyn: [phone] Primary support relationship to patient:  SPOUSE Degree of support available:   Adequate.    CURRENT CONCERNS Current Concerns  Post-Acute Placement   Other Concerns:    SOCIAL WORK ASSESSMENT / PLAN Clinical Social Worker recieved referral indicating pt would benefit from ST-SNF to assist with increasing level of independence and safety.  CSW reviewed chart and attempted to meet with pt; currently resting and no family at bedside. CSW staffed case with MD.  CSW phoned pt's wife who confirmed plan for [facility].  CSW reviewed SNF process.  CSW spoke with [facility] who is able to accept pt today.  CSW updated MD.  Family to transport. CSW to sign off at time of dc.   Assessment/plan status:  Information/Referral to [facility] Other assessment/ plan:   Information/referral to community resources:   SNF.    PATIENTS/FAMILYS RESPONSE TO PLAN OF CARE: Pt currently resting.  Pt's wife was pleasant and engaged in conversation.  Wife thanked CSW for intervention.

## 2011-12-23 NOTE — Discharge Summary (Signed)
Discharge Summary   Patient ID: [name],  MRN: [id number], DOB/AGE: 76-Jan-1932 76 y.o.  Admit date: 12/20/2011 Discharge date: 12/23/2011  Primary Physician: No primary provider on file. Primary Cardiologist: Hillis Range, MD (EP)  Discharge Diagnoses Principal Problem:  *Second degree Mobitz II AV block Active Problems:  Pulmonary nodules  Essential hypertension, malignant  Allergies No Known Allergies  Diagnostic Studies/Procedures  PA/LATERAL CHEST X-RAY - 12/21/11  CHEST - 2 VIEW  Comparison: None.  Findings: Prominent aortic contour with mild tortuosity. Heart  size within normal limits. Eventration of the hemidiaphragms.  There are bilateral pulmonary nodules, including a cluster of  nodules within the left mid lung. The density is high which may  reflect calcification. A spinal stimulator projects over the T10  vertebral body. No pleural effusion or pneumothorax. No acute  osseous finding.  IMPRESSION:  Bilateral pulmonary nodules. I have no prior study to evaluate for  stability, therefore recommend CT if this is an unknown finding.   2D ECHO- 12/21/11  Impressions:  - Echo images were suboptimal. 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 heart. No evidence of dextrocardia with normal liver position. Reviewed CXR and left sided aortic root, stomach bubble on right. Bubble study from left arm shows normal travel to RA/RV with no evidence of left sided IVC  PA/LATERAL CHEST X-RAY - 12/22/11  CHEST - 2 VIEW  Comparison: 12/21/2011  Findings: The cardiac shadow is stable. A dual lead pacemaker is  now noted. No pneumothorax is seen. The previously noted  pulmonary nodules are less well visualized on the current exam due  to some patient rotation and technical factors. No focal  infiltrate is seen.  IMPRESSION:  No evidence of pneumothorax following pacemaker placement.  The pulmonary nodules are less well visualized on the current exam.  CT is again  recommended for further evaluation.  CT CHEST WO CONTRAST 12/22/11  CT CHEST WITHOUT CONTRAST  Technique: Multidetector CT imaging of the chest was performed  following the standard protocol without IV contrast.  Comparison: Chest x-ray 12/22/2011.  Findings:  Mediastinum: Heart size is normal. There is no significant  pericardial fluid, thickening or pericardial calcification. There  is atherosclerosis of the thoracic aorta, the great vessels of the  mediastinum and the coronary arteries, including calcified  atherosclerotic plaque in the left anterior descending, left  circumflex and right coronary arteries. No pathologically enlarged  mediastinal or hilar lymph nodes. Please note that accurate  exclusion of hilar adenopathy is limited on noncontrast CT scans.  Esophagus is unremarkable in appearance. Left-sided pacemaker  device in place with lead tips terminating in the right atrial  appendage and right ventricular apex.  Lungs/Pleura: Numerous bilateral pulmonary nodules are noted.  Lesions include a 2.6 x 1.8 cm nodule in the medial aspect of the  right lower lobe (image 48 of series 3), a 1.6 x 1.4 cm nodule in  the left lower lobe (image 44 of series 3) and a 2.2 x 1.3 cm  pleural-based nodule the anterior aspect of the right upper lobe  (image 23 of series 3). Many additional nodules are also seen  scattered throughout the lungs bilaterally. Azygos lobe (normal  anatomical variant) incidentally noted. No consolidative air space  disease. No pleural effusion. Small right-sided Bochdalek hernia  is incidentally noted.  Upper Abdomen: High attenuation material is layering dependently  within the lumen of the gallbladder, compatible with small  gallstones. No signs of acute cholecystitis. A few colonic  diverticula are  noted.  Musculoskeletal: There are no aggressive appearing lytic or blastic  lesions noted in the visualized portions of the skeleton.  IMPRESSION:  1.  Numerous bilateral pulmonary nodules, as above, highly  suspicious for metastatic disease. Further evaluation with PET CT  and/or biopsy is recommended if clinically indicated.  2. Atherosclerosis, including three-vessel coronary artery disease.  3. Cholelithiasis without findings to suggest acute cholecystitis.  4. Additional incidental findings, as above.  History of Present Illness  [name] is a 78yo male who was admitted to [facility] hospital on 11/20/11 with the above problem list. 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 He was eating breakfast at [employer] on 12/19/11 and passed out. He was awoken by one of the workers and drove home; however, he continued to have syncopal episodes. He subsequently presented to [facility]. In the ED, monitor revealed complete heart block. He was transferred to [facility] for further evaluation/EP consult. Atenolol was held and allowed to wash out. He denied chest pain, shortness of breath during this time. CXR as above revealed bilateral pulmonary nodules. A 2D echo as above was suboptimal, LVEF 55-60%, mild LVH and revealed questionable mesocardia. He remained stable. Mobitz, type 2 with transient CHB was confirmed.   Hospital Course   The patient was informed, consented and prepped for pacemaker implantation. There were no complications. This resulted in the successful implantation of a St. Jude dual-chamber PPM. Follow-up CXR revealed no evidence of PTX, but persistent pulmonary nodules. A chest CT confirmed numerous bilateral pulmonary nodules highly suspicious for metastatic dz in addition to CAD, cholelithiasis w/o cholecystitis.   He remained asymptomatic post-procedure. He was evaluated by Dr. Johney Frame, and found to be stable for discharge.  Patient has prior AKA and employs the use of a walker for ambulation. Due to restricted arm motion post-device placement,  PT consultation was requested. The recommendation was made for short-term SNF placement, and has been arranged. He will be discharged to SNF. He has been scheduled for wound check follow-up next week and follow-up with Dr. Johney Frame in 3 months (noted below). His PCP has been contacted regarding the CXR and CT findings. He will follow-up next week, and undergo recommended PET CT. This information, including post-device instructions, has been clearly outlined in the discharge AVS.  Discharge Vitals:  Blood pressure 163/92, pulse 88, temperature 97.5 F (36.4 C), temperature source Oral, resp. rate 18, height 6\' 2"  (1.88 m), weight 88.1 kg (194 lb 3.6 oz), SpO2 95.00%.   Labs: Recent Labs  Basename 12/21/11 0540 12/20/11 2311   WBC 8.4 9.6   HGB 15.6 15.6   HCT 44.4 45.2   MCV 93.7 92.8   PLT 172 171    Lab 12/21/11 0540 12/20/11 2311  NA 139 140  K 3.8 3.8  CL 103 105  CO2 26 24  BUN 16 16  CREATININE 0.94 0.85  CALCIUM 9.2 9.5  PROT -- 6.2  BILITOT -- 0.7  ALKPHOS -- 49  ALT -- 20  AST -- 19  AMYLASE -- --  LIPASE -- --  GLUCOSE 97 103*    Basename 12/21/11 1300  TSH 1.701  T4TOTAL --  T3FREE --  THYROIDAB --    Disposition:  Discharge Orders    Future Appointments: Provider: Department: Dept Phone: Center:   12/29/2011 11:30 AM [facility] [employer] Main Office [place]) [phone] [facility]   12/29/2011  4:00 PM [facility] [employer] Main Office [place]) [phone] [facility]   2020-11-2812 3:15 PM Duke Salvia, MD [facility] at [place] [phone] [facility]   03/28/2012 2:00 PM Hillis Range, MD [facility] Main Office [place]) [phone] [facility]     Follow-up Information    Follow up with Jaclyn Shaggy, MD. On 12/30/2011. (At 11:00 AM for follow-up and further work-up for abnormal chest x-ray and CT findings. )    Contact information:   [address]   [place] [address] [phone]        Follow up with Hillis Range, MD. On 03/28/2012. (At 2:00 PM for follow-up. )    Contact information:   [address], [address] [place] [address] [phone]       Follow up with [facility]. On 12/29/2011. (At 4:00 PM for wound check for pacemaker. )    Contact information:   [address] [place] [address]          Discharge Medications:    Medication List     As of 12/23/2011  2:05 PM    START taking these medications         HYDROcodone-acetaminophen 5-325 MG per tablet   Commonly known as: NORCO/VICODIN   Take 1-2 tablets by mouth every 4 (four) hours as needed.      CONTINUE taking these medications         aspirin EC 81 MG tablet      atenolol 25 MG tablet   Commonly known as: TENORMIN      atorvastatin 20 MG tablet   Commonly known as: LIPITOR      imipramine 25 MG tablet   Commonly known as: TOFRANIL      multivitamin with minerals Tabs          Where to get your medications    These are the prescriptions that you need to pick up.   You may get these medications from any pharmacy.         HYDROcodone-acetaminophen 5-325 MG per tablet           Outstanding Labs/Studies: None  Duration of Discharge Encounter: Greater than 30 minutes including physician time.  Signed, R. Hurman Horn, PA-C 12/23/2011, 2:05 PM  Hillis Range, MD

## 2011-12-23 NOTE — Clinical Social Work Placement (Signed)
     Clinical Social Work Department CLINICAL SOCIAL WORK PLACEMENT NOTE 12/23/2011  Patient:  [name], [name]  Account Number:  [id number] Admit date:  12/20/2011  Clinical Social Worker:  Margaree Mackintosh  Date/time:  12/23/2011 12:00 M  Clinical Social Work is seeking post-discharge placement for this patient at the following level of care:   SKILLED NURSING   (*CSW will update this form in Epic as items are completed)   12/23/2011  Patient/family provided with [facility] Department of Clinical Social Works list of facilities offering this level of care within the geographic area requested by the patient (or if unable, by the patients family).  12/23/2011  Patient/family informed of their freedom to choose among providers that offer the needed level of care, that participate in Medicare, Medicaid or managed care program needed by the patient, have an available bed and are willing to accept the patient.  12/23/2011  Patient/family informed of [facility] ownership interest in [facility], as well as of the fact that they are under no obligation to receive care at this facility.  PASARR submitted to EDS on 12/23/2011 PASARR number received from EDS on 12/23/2011  FL2 transmitted to all facilities in geographic area requested by pt/family on  12/23/2011 FL2 transmitted to all facilities within larger geographic area on   Patient informed that his/her managed care company has contracts with or will negotiate with  certain facilities, including the following:     Patient/family informed of bed offers received:  12/23/2011 Patient chooses bed at [facility] Physician recommends and patient chooses bed at  [facility]  Patient to be transferred to [facility] on  12/23/2011 Patient to be transferred to facility by Family.  The following physician request were entered in Epic:   Additional Comments:

## 2011-12-23 NOTE — Evaluation (Signed)
Physical Therapy Evaluation Patient Details Name: [name] MRN: [id number] DOB: [birth year] Today's Date: 12/23/2011 Time: 4098-1191 PT Time Calculation (min): 15 min  PT Assessment / Plan / Recommendation Clinical Impression  Pt s/p pacemaker placement for 2nd degree heart block.  Pt also has old rt AKA with prosthetic.  Pt and wife care for disabled daughter at home and pt's wife cannot provide physical assistance for pt.  Feel pt needs ST-SNF prior to return home.    PT Assessment  Patient needs continued PT services    Follow Up Recommendations  SNF    Does the patient have the potential to tolerate intense rehabilitation      Barriers to Discharge Decreased caregiver support      Equipment Recommendations  None recommended by PT    Recommendations for Other Services     Frequency Min 3X/week    Precautions / Restrictions Precautions Precautions: ICD/Pacemaker Precaution Comments: Pt with sling on lt arm.  Pulled sling up to allow use of walker.   Pertinent Vitals/Pain Soreness at pacemaker site.      Mobility  Transfers Transfers: Sit to Stand;Stand to Sit Sit to Stand: 4: Min assist;With upper extremity assist;With armrests;From chair/3-in-1 Stand to Sit: 4: Min assist;With upper extremity assist;To bed Details for Transfer Assistance: assist to bring hips unpright and incr to come fully erect Ambulation/Gait Ambulation/Gait Assistance: 4: Min assist Ambulation Distance (Feet): 25 Feet Assistive device: Rolling walker Ambulation/Gait Assistance Details: Cues to stand more erect. Gait Pattern: Step-through pattern;Decreased stride length;Trunk flexed Gait velocity: decr    Shoulder Instructions     Exercises     PT Diagnosis: Difficulty walking;Generalized weakness  PT Problem List: Decreased strength;Decreased activity tolerance;Decreased balance;Decreased mobility PT Treatment Interventions: DME instruction;Gait training;Stair training;Functional  mobility training;Patient/family education;Therapeutic activities;Therapeutic exercise;Balance training   PT Goals Acute Rehab PT Goals PT Goal Formulation: With patient Time For Goal Achievement: 12/30/11 Potential to Achieve Goals: Good Pt will go Supine/Side to Sit: with modified independence PT Goal: Supine/Side to Sit - Progress: Goal set today Pt will go Sit to Supine/Side: with modified independence PT Goal: Sit to Supine/Side - Progress: Goal set today Pt will go Sit to Stand: with modified independence PT Goal: Sit to Stand - Progress: Goal set today Pt will go Stand to Sit: with modified independence PT Goal: Stand to Sit - Progress: Goal set today Pt will Ambulate: 51 - 150 feet;with modified independence PT Goal: Ambulate - Progress: Goal set today Pt will Go Up / Down Stairs: 1-2 stairs;with supervision;with least restrictive assistive device PT Goal: Up/Down Stairs - Progress: Goal set today  Visit Information  Last PT Received On: 12/23/11 Assistance Needed: +1    Subjective Data  Subjective: Pt states he feels weak. Patient Stated Goal: To return home after stronger.   Prior Functioning  Home Living Lives With: Spouse;Daughter (daughter is disabled and wife can't assist pt physically) Available Help at Discharge: Family Type of Home: House Home Access: Stairs to enter Secretary/administrator of Steps: 2 Entrance Stairs-Rails: Left Home Layout: Two level;Able to live on main level with bedroom/bathroom Home Adaptive Equipment: Straight cane;Walker - rolling Prior Function Level of Independence: Independent with assistive device(s) (uses cane except at night uses walker) Able to Take Stairs?: Yes Vocation: Retired Musician: No difficulties    Cognition  Overall Cognitive Status: Appears within functional limits for tasks assessed/performed Arousal/Alertness: Awake/alert Orientation Level: Appears intact for tasks assessed Behavior  During Session: [facility] for tasks performed  Extremity/Trunk Assessment Right Lower Extremity Assessment RLE ROM/Strength/Tone: Deficits RLE ROM/Strength/Tone Deficits: grossly 4/5 Left Lower Extremity Assessment LLE ROM/Strength/Tone: Deficits LLE ROM/Strength/Tone Deficits: grossly 4/5   Balance Static Standing Balance Static Standing - Balance Support: Bilateral upper extremity supported;During functional activity (on walker) Static Standing - Level of Assistance: 4: Min assist  End of Session PT - End of Session Equipment Utilized During Treatment: Gait belt Activity Tolerance: Patient limited by fatigue Patient left: in bed;with call bell/phone within reach;with family/visitor present Nurse Communication: Mobility status  GP     Barabara Motz 12/23/2011, 11:56 AM  Skip Mayer PT [phone]

## 2011-12-23 NOTE — Telephone Encounter (Signed)
FYI See below 

## 2011-12-23 NOTE — Progress Notes (Signed)
   ELECTROPHYSIOLOGY ROUNDING NOTE    Patient Name: [name] Date of Encounter: 12-22-2011    SUBJECTIVE:Patient feels well.  No chest pain or shortness of breath.  No events overnight  TELEMETRY: Reviewed telemetry pt in sinus rhythm with frequent PVCs Filed Vitals:   12/22/11 0902 12/22/11 1230 12/22/11 2100 12/23/11 0500  BP: 120/62 139/68 135/74 133/77  Pulse: 93 83 112 88  Temp: 98.2 F (36.8 C) 98 F (36.7 C) 97.9 F (36.6 C) 97.5 F (36.4 C)  TempSrc: Oral     Resp:  18 20 18   Height:      Weight:      SpO2: 93% 95% 95% 95%    Intake/Output Summary (Last 24 hours) at 12/23/11 0751 Last data filed at 12/22/11 2200  Gross per 24 hour  Intake   1020 ml  Output    350 ml  Net    670 ml    Physical Exam:  GEN- The patient is well appearing, alert and oriented x 3 today.   Head- normocephalic, atraumatic Eyes-  Sclera clear, conjunctiva pink Ears- hearing intact Oropharynx- clear Neck- supple,   Lungs- Clear to ausculation bilaterally, normal work of breathing Chest- pacemaker pocket is without hematoma  CV- RRR GI- soft, NT, ND, + BS Extremities- no clubbing, cyanosis, or edema, s/p BKA  Pacemaker interrogation- reviewed in detail today,  See Paper chart  LABS: Basic Metabolic Panel:  Basename 12/21/11 0540 12/20/11 2311  NA 139 140  K 3.8 3.8  CL 103 105  CO2 26 24  GLUCOSE 97 103*  BUN 16 16  CREATININE 0.94 0.85  CALCIUM 9.2 9.5  MG -- --  PHOS -- --   Liver Function Tests:  [facility] 12/20/11 2311  AST 19  ALT 20  ALKPHOS 49  BILITOT 0.7  PROT 6.2  ALBUMIN 3.6   CBC:  Basename 12/21/11 0540 12/20/11 2311  WBC 8.4 9.6  NEUTROABS -- --  HGB 15.6 15.6  HCT 44.4 45.2  MCV 93.7 92.8  PLT 172 171    Thyroid Function Tests:  Basename 12/21/11 1300  TSH 1.701  T4TOTAL --  T3FREE --  THYROIDAB --    Chest CT- reveals multiple pulmonary nodules concerning for metastatic disease.  I had a long discussion with the patient  regarding results.  He would like to have further workup (PET CT) arranged through [facility] via Dr Arlana Pouch his PCP.  He understands the importance of close follow-up on this with Dr Arlana Pouch.  Active Problems:  Second degree Mobitz II AV block   Assessment and Plan:  1. Mobitz II Second degree AV block Doing well s/p PPM No driving x 6 weeks  2. Pulmonary nodules Concerning for metastatic disease.  I had a long discussion with the patient regarding results.  He would like to have further workup (PET CT) arranged through [facility] via Dr Arlana Pouch his PCP.  He understands the importance of close follow-up on this with Dr Arlana Pouch.  3. BKA Will ask PT to ambulate and assist with weight bearing instructions with new PPM  Dr to home today He will need a wound check in our office in 10 days and follow-up with me in 3 months  Follow-up with PCP in 1 week

## 2011-12-23 NOTE — Progress Notes (Signed)
Clinical Social Worker spoke with pt's son, Lyda Jester, at bedside. Lyda Jester confirmed plan to transport pt to [facility]	[place].  CSW submitted dc summary to facility.  Facility number for report: [phone]     Angelia Mould, MSW, [place] [phone]

## 2011-12-29 ENCOUNTER — Ambulatory Visit (INDEPENDENT_AMBULATORY_CARE_PROVIDER_SITE_OTHER): Payer: Medicare Other | Admitting: *Deleted

## 2011-12-29 ENCOUNTER — Ambulatory Visit: Payer: Medicare Other

## 2011-12-29 ENCOUNTER — Encounter: Payer: Self-pay | Admitting: Internal Medicine

## 2011-12-29 DIAGNOSIS — I441 Atrioventricular block, second degree: Secondary | ICD-10-CM

## 2011-12-29 LAB — PACEMAKER DEVICE OBSERVATION
AL IMPEDENCE PM: 425 Ohm
ATRIAL PACING PM: 1
DEVICE MODEL PM: 7412730
RV LEAD IMPEDENCE PM: 700 Ohm
RV LEAD THRESHOLD: 0.625 V
VENTRICULAR PACING PM: 1

## 2011-12-29 NOTE — Progress Notes (Signed)
Wound check-PPM 

## 2012-01-02 ENCOUNTER — Ambulatory Visit: Payer: Self-pay | Admitting: Internal Medicine

## 2012-01-15 DIAGNOSIS — R918 Other nonspecific abnormal finding of lung field: Secondary | ICD-10-CM

## 2012-01-15 HISTORY — DX: Other nonspecific abnormal finding of lung field: R91.8

## 2012-01-23 ENCOUNTER — Ambulatory Visit: Payer: Self-pay | Admitting: Oncology

## 2012-01-23 LAB — CREATININE, SERUM: Creatinine: 1.16 mg/dL (ref 0.60–1.30)

## 2012-01-24 LAB — PSA: PSA: 1.4 ng/mL (ref 0.0–4.0)

## 2012-01-24 LAB — CEA: CEA: 1.8 ng/mL (ref 0.0–4.7)

## 2012-02-15 ENCOUNTER — Ambulatory Visit: Payer: Self-pay | Admitting: Oncology

## 2012-02-21 ENCOUNTER — Ambulatory Visit: Payer: Self-pay | Admitting: General Surgery

## 2012-02-27 ENCOUNTER — Ambulatory Visit: Payer: Self-pay | Admitting: Oncology

## 2012-02-27 LAB — APTT: Activated PTT: 26.6 secs (ref 23.6–35.9)

## 2012-02-27 LAB — PROTIME-INR: INR: 0.9

## 2012-02-28 ENCOUNTER — Ambulatory Visit: Payer: Self-pay | Admitting: Oncology

## 2012-03-14 ENCOUNTER — Ambulatory Visit: Payer: Self-pay | Admitting: Urology

## 2012-03-17 ENCOUNTER — Ambulatory Visit: Payer: Self-pay | Admitting: Oncology

## 2012-03-27 ENCOUNTER — Encounter: Payer: Medicare Other | Admitting: Internal Medicine

## 2012-03-28 ENCOUNTER — Ambulatory Visit: Payer: Medicare Other | Admitting: Internal Medicine

## 2012-04-03 ENCOUNTER — Encounter: Payer: Self-pay | Admitting: Internal Medicine

## 2012-04-03 ENCOUNTER — Ambulatory Visit (INDEPENDENT_AMBULATORY_CARE_PROVIDER_SITE_OTHER): Payer: Medicare Other | Admitting: Internal Medicine

## 2012-04-03 VITALS — BP 143/82 | HR 109 | Ht 74.0 in | Wt 209.5 lb

## 2012-04-03 DIAGNOSIS — R0602 Shortness of breath: Secondary | ICD-10-CM

## 2012-04-03 DIAGNOSIS — I10 Essential (primary) hypertension: Secondary | ICD-10-CM

## 2012-04-03 DIAGNOSIS — I441 Atrioventricular block, second degree: Secondary | ICD-10-CM

## 2012-04-03 DIAGNOSIS — Z950 Presence of cardiac pacemaker: Secondary | ICD-10-CM

## 2012-04-03 LAB — PACEMAKER DEVICE OBSERVATION
AL THRESHOLD: 0.875 V
ATRIAL PACING PM: 1
DEVICE MODEL PM: 7412730
RV LEAD IMPEDENCE PM: 662.5 Ohm
RV LEAD THRESHOLD: 0.5 V

## 2012-04-03 NOTE — Assessment & Plan Note (Signed)
The patient's device was interrogated and the information was fully reviewed.  The device was reprogrammed to maximize longevity  

## 2012-04-03 NOTE — Assessment & Plan Note (Signed)
Stable post pacing 

## 2012-04-03 NOTE — Patient Instructions (Addendum)
Your physician wants you to follow-up in: November with Dr. Graciela Husbands and Device check. You will receive a reminder letter in the mail two months in advance. If you don't receive a letter, please call our office to schedule the follow-up appointment.

## 2012-04-03 NOTE — Progress Notes (Signed)
Patient Care Team: Jaclyn Shaggy as PCP - General (Unknown Physician Specialty)   HPI  [name] is a 79 y.o. male Seen in followup for PM implanted 11/13 for syncope and intermittent complete heart block  A chest CT confirmed numerous bilateral pulmonary nodules highly suspicious for metastatic dz.  He underwent biopsy>>metastatic prostate Ca and on hormone therapy  Some SOB but no recurrent syncope    Patient has prior AKA and employs the use of a walker for ambulation   Past Medical History  Diagnosis Date  . Hypertension   . Cancer     prostate 2007  . Headache     migraines w/o HA; [place]  . Multiple lung nodules 01/2012    Past Surgical History  Procedure Laterality Date  . Joint replacement      both R & L replacement;  then R AKA  . Prostatectomy  2007  . Leg amputation above knee  2009    R leg  . Hernia repair      inguinal hernia  . Insert / replace / remove pacemaker  12/2011    St. Jude    Current Outpatient Prescriptions  Medication Sig Dispense Refill  . atenolol (TENORMIN) 25 MG tablet Take 25 mg by mouth daily.      Marland Kitchen imipramine (TOFRANIL) 25 MG tablet Take 50 mg by mouth every morning.      . Multiple Vitamin (MULTIVITAMIN WITH MINERALS) TABS Take 1 tablet by mouth 2 (two) times daily.      . NON FORMULARY Hormone injection every 4 months.      . Red Yeast Rice Extract (RED YEAST RICE PO) Take by mouth 2 (two) times daily.       No current facility-administered medications for this visit.    No Known Allergies  Review of Systems negative except from HPI and PMH  Physical Exam BP 143/82  Pulse 109  Ht 6\' 2"  (1.88 m)  Wt 209 lb 8 oz (95.029 kg)  BMI 26.89 kg/m2 Well developed and well nourished in no acute distress HENT normal E scleral and icterus clear Neck Supple JVP flat; carotids brisk and full Diffuse crackles Device pocket well healed; without hematoma or erythema Regular rate and rhythm, no murmurs gallops or rub Soft with  active bowel sounds No clubbing cyanosis none Edema Alert and oriented, grossly normal motor and sensory function S/p RAKA ; walks with cane Skin Warm and Dry    Assessment and  Plan

## 2012-07-02 ENCOUNTER — Ambulatory Visit: Payer: Self-pay | Admitting: Oncology

## 2012-07-03 ENCOUNTER — Ambulatory Visit: Payer: Self-pay | Admitting: Oncology

## 2012-07-03 LAB — BASIC METABOLIC PANEL
Anion Gap: 8 (ref 7–16)
BUN: 20 mg/dL — ABNORMAL HIGH (ref 7–18)
EGFR (Non-African Amer.): 56 — ABNORMAL LOW
Glucose: 115 mg/dL — ABNORMAL HIGH (ref 65–99)
Osmolality: 285 (ref 275–301)
Potassium: 4.4 mmol/L (ref 3.5–5.1)
Sodium: 141 mmol/L (ref 136–145)

## 2012-07-05 ENCOUNTER — Ambulatory Visit: Payer: Self-pay | Admitting: Oncology

## 2012-07-15 ENCOUNTER — Ambulatory Visit: Payer: Self-pay | Admitting: Oncology

## 2012-11-08 ENCOUNTER — Ambulatory Visit: Payer: Self-pay | Admitting: Oncology

## 2012-11-08 LAB — BASIC METABOLIC PANEL
Calcium, Total: 9.3 mg/dL (ref 8.5–10.1)
Creatinine: 1.11 mg/dL (ref 0.60–1.30)
EGFR (African American): >60
EGFR (Non-African Amer.): >60
Glucose: 109 mg/dL — ABNORMAL HIGH (ref 65–99)
Osmolality: 280 (ref 275–301)
Potassium: 4.4 mmol/L (ref 3.5–5.1)
Sodium: 139 mmol/L (ref 136–145)

## 2012-11-14 ENCOUNTER — Ambulatory Visit: Payer: Self-pay | Admitting: Oncology

## 2013-01-04 ENCOUNTER — Ambulatory Visit (INDEPENDENT_AMBULATORY_CARE_PROVIDER_SITE_OTHER): Payer: Medicare Other | Admitting: Internal Medicine

## 2013-01-04 ENCOUNTER — Encounter: Payer: Self-pay | Admitting: Internal Medicine

## 2013-01-04 VITALS — BP 120/70 | HR 71 | Ht 74.0 in | Wt 218.5 lb

## 2013-01-04 DIAGNOSIS — I441 Atrioventricular block, second degree: Secondary | ICD-10-CM

## 2013-01-04 DIAGNOSIS — Z950 Presence of cardiac pacemaker: Secondary | ICD-10-CM

## 2013-01-04 DIAGNOSIS — I442 Atrioventricular block, complete: Secondary | ICD-10-CM

## 2013-01-04 LAB — MDC_IDC_ENUM_SESS_TYPE_INCLINIC
Battery Remaining Longevity: 99 mo
Battery Voltage: 2.96 V
Brady Statistic RA Percent Paced: 1 %
Brady Statistic RV Percent Paced: 1 %
Implantable Pulse Generator Model: 2210
Implantable Pulse Generator Serial Number: 7412730
Lead Channel Impedance Value: 340 Ohm
Lead Channel Impedance Value: 650 Ohm
Lead Channel Pacing Threshold Amplitude: 0.625 V
Lead Channel Pacing Threshold Amplitude: 0.875 V
Lead Channel Pacing Threshold Pulse Width: 0.4 ms
Lead Channel Pacing Threshold Pulse Width: 0.5 ms
Lead Channel Sensing Intrinsic Amplitude: 2.6 mV
Lead Channel Sensing Intrinsic Amplitude: 7.7 mV
Lead Channel Setting Pacing Amplitude: 0.75 V
Lead Channel Setting Pacing Amplitude: 1.875
Lead Channel Setting Pacing Pulse Width: 0.4 ms
Lead Channel Setting Sensing Sensitivity: 1.5 mV

## 2013-01-04 NOTE — Progress Notes (Signed)
      Patient Care Team: Jaclyn Shaggy as PCP - General (Unknown Physician Specialty)   HPI  [name] is a 79 y.o. male Seen in followup for PM implanted 11/13 for syncope and intermittent complete heart block  A chest CT confirmed numerous bilateral pulmonary nodules highly suspicious for metastatic dz. He underwent biopsy>>metastatic prostate Ca and on hormone therapy   Some SOB but no recurrent syncope  Patient has prior AKA and employs the use of a walker for ambulation   Past Medical History  Diagnosis Date  . Hypertension   . Cancer     prostate 2007  . Headache(784.0)     migraines w/o HA; [place]  . Multiple lung nodules 01/2012  . Pacemaker-St.Jude 12/22/2011  . Atrioventricular block, complete 12/21/2011    Past Surgical History  Procedure Laterality Date  . Joint replacement      both R & L replacement;  then R AKA  . Prostatectomy  2007  . Leg amputation above knee  2009    R leg  . Hernia repair      inguinal hernia  . Insert / replace / remove pacemaker  12/2011    St. Jude    Current Outpatient Prescriptions  Medication Sig Dispense Refill  . atenolol (TENORMIN) 25 MG tablet Take 25 mg by mouth daily.      Marland Kitchen atorvastatin (LIPITOR) 10 MG tablet Take 10 mg by mouth daily.       Marland Kitchen imipramine (TOFRANIL) 25 MG tablet Take 50 mg by mouth every morning.      . Multiple Vitamin (MULTIVITAMIN WITH MINERALS) TABS Take 1 tablet by mouth 2 (two) times daily.      . NON FORMULARY Hormone injection every 4 months.      . Red Yeast Rice Extract (RED YEAST RICE PO) Take by mouth 2 (two) times daily.       No current facility-administered medications for this visit.    No Known Allergies  Review of Systems negative except from HPI and PMH  Physical Exam BP 120/70  Pulse 71  Ht 6\' 2"  (1.88 m)  Wt 218 lb 8 oz (99.111 kg)  BMI 28.04 kg/m2 Well developed and well nourished in no acute distress HENT normal E scleral and icterus clear Neck Supple JVP flat;  carotids brisk and full Clear to ausculation Device pocket well healed; without hematoma or erythema.  There is no tethering Regular rate and rhythm,2/6 murmuer with split s@R . gallops or rub Soft with active bowel sounds No clubbing cyanosis  No  Edema Alert and oriented, grossly normal motor and sensory function Skin Warm mildly diaphoretic a a a   Assessment and  Plan

## 2013-01-04 NOTE — Assessment & Plan Note (Signed)
3% pacing

## 2013-01-04 NOTE — Patient Instructions (Addendum)
Your physician recommends that you continue on your current medications as directed. Please refer to the Current Medication list given to you today.  Remote monitoring is used to monitor your Pacemaker of ICD from home. This monitoring reduces the number of office visits required to check your device to one time per year. It allows us to keep an eye on the functioning of your device to ensure it is working properly. You are scheduled for a device check from home on 04/08/2013. You may send your transmission at any time that day. If you have a wireless device, the transmission will be sent automatically. After your physician reviews your transmission, you will receive a postcard with your next transmission date.  Your physician wants you to follow-up in: one year with Dr. Klein.  You will receive a reminder letter in the mail two months in advance. If you don't receive a letter, please call our office to schedule the follow-up appointment.    

## 2013-01-04 NOTE — Assessment & Plan Note (Signed)
The patient's device was interrogated.  The information was reviewed. No changes were made in the programming.    

## 2013-03-08 ENCOUNTER — Ambulatory Visit: Payer: Self-pay | Admitting: Oncology

## 2013-03-11 LAB — BASIC METABOLIC PANEL
Anion Gap: 7 (ref 7–16)
BUN: 24 mg/dL — ABNORMAL HIGH (ref 7–18)
CREATININE: 1.11 mg/dL (ref 0.60–1.30)
Calcium, Total: 8.6 mg/dL (ref 8.5–10.1)
Chloride: 105 mmol/L (ref 98–107)
Co2: 27 mmol/L (ref 21–32)
Glucose: 138 mg/dL — ABNORMAL HIGH (ref 65–99)
OSMOLALITY: 284 (ref 275–301)
Potassium: 4.4 mmol/L (ref 3.5–5.1)
Sodium: 139 mmol/L (ref 136–145)

## 2013-03-11 LAB — CBC CANCER CENTER
BASOS ABS: 0.1 x10 3/mm (ref 0.0–0.1)
Basophil %: 1.2 %
EOS PCT: 2.6 %
Eosinophil #: 0.2 x10 3/mm (ref 0.0–0.7)
HCT: 44.5 % (ref 40.0–52.0)
HGB: 14.7 g/dL (ref 13.0–18.0)
LYMPHS PCT: 22.5 %
Lymphocyte #: 1.5 x10 3/mm (ref 1.0–3.6)
MCH: 31.4 pg (ref 26.0–34.0)
MCHC: 33 g/dL (ref 32.0–36.0)
MCV: 95 fL (ref 80–100)
Monocyte #: 0.5 x10 3/mm (ref 0.2–1.0)
Monocyte %: 7.9 %
NEUTROS ABS: 4.4 x10 3/mm (ref 1.4–6.5)
NEUTROS PCT: 65.8 %
Platelet: 166 x10 3/mm (ref 150–440)
RBC: 4.68 10*6/uL (ref 4.40–5.90)
RDW: 13 % (ref 11.5–14.5)
WBC: 6.7 x10 3/mm (ref 3.8–10.6)

## 2013-03-12 LAB — PSA: PSA: <0.1 ng/mL (ref 0.0–4.0)

## 2013-03-17 ENCOUNTER — Ambulatory Visit: Payer: Self-pay | Admitting: Oncology

## 2013-04-04 ENCOUNTER — Inpatient Hospital Stay: Payer: Self-pay | Admitting: General Surgery

## 2013-04-04 DIAGNOSIS — K8000 Calculus of gallbladder with acute cholecystitis without obstruction: Secondary | ICD-10-CM

## 2013-04-04 HISTORY — PX: CHOLECYSTECTOMY: SHX55

## 2013-04-04 LAB — COMPREHENSIVE METABOLIC PANEL
ALK PHOS: 142 U/L — AB
ANION GAP: 8 (ref 7–16)
Albumin: 3.1 g/dL — ABNORMAL LOW (ref 3.4–5.0)
BUN: 18 mg/dL (ref 7–18)
Bilirubin,Total: 1.9 mg/dL — ABNORMAL HIGH (ref 0.2–1.0)
CREATININE: 0.99 mg/dL (ref 0.60–1.30)
Calcium, Total: 9.6 mg/dL (ref 8.5–10.1)
Chloride: 99 mmol/L (ref 98–107)
Co2: 26 mmol/L (ref 21–32)
EGFR (African American): >60
GLUCOSE: 137 mg/dL — AB (ref 65–99)
OSMOLALITY: 270 (ref 275–301)
Potassium: 4.5 mmol/L (ref 3.5–5.1)
SGOT(AST): 322 U/L — ABNORMAL HIGH (ref 15–37)
SGPT (ALT): 309 U/L — ABNORMAL HIGH (ref 12–78)
SODIUM: 133 mmol/L — AB (ref 136–145)
Total Protein: 6.7 g/dL (ref 6.4–8.2)

## 2013-04-04 LAB — URINALYSIS, COMPLETE
Blood: NEGATIVE
Glucose,UR: 50 mg/dL (ref 0–75)
Hyaline Cast: 3
LEUKOCYTE ESTERASE: NEGATIVE
Nitrite: NEGATIVE
Ph: 5 (ref 4.5–8.0)
RBC,UR: 3 /HPF (ref 0–5)
SQUAMOUS EPITHELIAL: NONE SEEN
Specific Gravity: 1.035 (ref 1.003–1.030)
WBC UR: 10 /HPF (ref 0–5)

## 2013-04-04 LAB — CBC
HCT: 44.8 % (ref 40.0–52.0)
HGB: 15 g/dL (ref 13.0–18.0)
MCH: 31.3 pg (ref 26.0–34.0)
MCHC: 33.5 g/dL (ref 32.0–36.0)
MCV: 93 fL (ref 80–100)
PLATELETS: 221 10*3/uL (ref 150–440)
RBC: 4.79 10*6/uL (ref 4.40–5.90)
RDW: 13.2 % (ref 11.5–14.5)
WBC: 20.9 10*3/uL — AB (ref 3.8–10.6)

## 2013-04-04 LAB — TROPONIN I

## 2013-04-04 LAB — PROTIME-INR
INR: 1.7
PROTHROMBIN TIME: 19.6 s — AB (ref 11.5–14.7)

## 2013-04-04 LAB — LIPASE, BLOOD: Lipase: 106 U/L (ref 73–393)

## 2013-04-05 LAB — CBC WITH DIFFERENTIAL/PLATELET
Basophil #: 0 10*3/uL (ref 0.0–0.1)
Basophil %: 0.3 %
EOS PCT: 1.3 %
Eosinophil #: 0.2 10*3/uL (ref 0.0–0.7)
HCT: 35.7 % — ABNORMAL LOW (ref 40.0–52.0)
HGB: 12.2 g/dL — ABNORMAL LOW (ref 13.0–18.0)
Lymphocyte #: 0.7 10*3/uL — ABNORMAL LOW (ref 1.0–3.6)
Lymphocyte %: 6 %
MCH: 32.6 pg (ref 26.0–34.0)
MCHC: 34.2 g/dL (ref 32.0–36.0)
MCV: 95 fL (ref 80–100)
MONO ABS: 1.2 x10 3/mm — AB (ref 0.2–1.0)
MONOS PCT: 9.8 %
NEUTROS PCT: 82.6 %
Neutrophil #: 10 10*3/uL — ABNORMAL HIGH (ref 1.4–6.5)
PLATELETS: 216 10*3/uL (ref 150–440)
RBC: 3.75 10*6/uL — AB (ref 4.40–5.90)
RDW: 13.4 % (ref 11.5–14.5)
WBC: 12.1 10*3/uL — ABNORMAL HIGH (ref 3.8–10.6)

## 2013-04-05 LAB — COMPREHENSIVE METABOLIC PANEL
ANION GAP: 10 (ref 7–16)
Albumin: 2 g/dL — ABNORMAL LOW (ref 3.4–5.0)
Alkaline Phosphatase: 99 U/L
BUN: 35 mg/dL — AB (ref 7–18)
Bilirubin,Total: 1 mg/dL (ref 0.2–1.0)
CHLORIDE: 102 mmol/L (ref 98–107)
CO2: 18 mmol/L — AB (ref 21–32)
Calcium, Total: 8.3 mg/dL — ABNORMAL LOW (ref 8.5–10.1)
Creatinine: 1.72 mg/dL — ABNORMAL HIGH (ref 0.60–1.30)
GFR CALC AF AMER: 43 — AB
GFR CALC NON AF AMER: 37 — AB
Glucose: 117 mg/dL — ABNORMAL HIGH (ref 65–99)
Osmolality: 270 (ref 275–301)
Potassium: 4.3 mmol/L (ref 3.5–5.1)
SGOT(AST): 149 U/L — ABNORMAL HIGH (ref 15–37)
SGPT (ALT): 174 U/L — ABNORMAL HIGH (ref 12–78)
Sodium: 130 mmol/L — ABNORMAL LOW (ref 136–145)
Total Protein: 5.7 g/dL — ABNORMAL LOW (ref 6.4–8.2)

## 2013-04-06 LAB — CBC WITH DIFFERENTIAL/PLATELET
BASOS ABS: 0.1 10*3/uL (ref 0.0–0.1)
BASOS PCT: 0.5 %
Eosinophil #: 0.4 10*3/uL (ref 0.0–0.7)
Eosinophil %: 4.4 %
HCT: 37.4 % — ABNORMAL LOW (ref 40.0–52.0)
HGB: 12.4 g/dL — ABNORMAL LOW (ref 13.0–18.0)
Lymphocyte #: 0.7 10*3/uL — ABNORMAL LOW (ref 1.0–3.6)
Lymphocyte %: 7.2 %
MCH: 31.4 pg (ref 26.0–34.0)
MCHC: 33.2 g/dL (ref 32.0–36.0)
MCV: 95 fL (ref 80–100)
Monocyte #: 1.1 x10 3/mm — ABNORMAL HIGH (ref 0.2–1.0)
Monocyte %: 11.5 %
NEUTROS ABS: 7.3 10*3/uL — AB (ref 1.4–6.5)
NEUTROS PCT: 76.4 %
PLATELETS: 239 10*3/uL (ref 150–440)
RBC: 3.95 10*6/uL — ABNORMAL LOW (ref 4.40–5.90)
RDW: 13.8 % (ref 11.5–14.5)
WBC: 9.6 10*3/uL (ref 3.8–10.6)

## 2013-04-06 LAB — COMPREHENSIVE METABOLIC PANEL
ALK PHOS: 102 U/L
Albumin: 2.1 g/dL — ABNORMAL LOW (ref 3.4–5.0)
Anion Gap: 3 — ABNORMAL LOW (ref 7–16)
BILIRUBIN TOTAL: 0.9 mg/dL (ref 0.2–1.0)
BUN: 30 mg/dL — ABNORMAL HIGH (ref 7–18)
CALCIUM: 8.2 mg/dL — AB (ref 8.5–10.1)
Chloride: 102 mmol/L (ref 98–107)
Co2: 29 mmol/L (ref 21–32)
Creatinine: 1.28 mg/dL (ref 0.60–1.30)
EGFR (African American): >60
EGFR (Non-African Amer.): 53 — ABNORMAL LOW
Glucose: 121 mg/dL — ABNORMAL HIGH (ref 65–99)
Osmolality: 276 (ref 275–301)
POTASSIUM: 3.8 mmol/L (ref 3.5–5.1)
SGOT(AST): 94 U/L — ABNORMAL HIGH (ref 15–37)
SGPT (ALT): 119 U/L — ABNORMAL HIGH (ref 12–78)
SODIUM: 134 mmol/L — AB (ref 136–145)
Total Protein: 5.6 g/dL — ABNORMAL LOW (ref 6.4–8.2)

## 2013-04-07 ENCOUNTER — Encounter: Payer: Self-pay | Admitting: General Surgery

## 2013-04-08 ENCOUNTER — Ambulatory Visit (INDEPENDENT_AMBULATORY_CARE_PROVIDER_SITE_OTHER): Payer: Medicare Other | Admitting: *Deleted

## 2013-04-08 DIAGNOSIS — I442 Atrioventricular block, complete: Secondary | ICD-10-CM

## 2013-04-08 LAB — COMPREHENSIVE METABOLIC PANEL
ALBUMIN: 2.1 g/dL — AB (ref 3.4–5.0)
ALK PHOS: 93 U/L
ALT: 140 U/L — AB (ref 12–78)
AST: 154 U/L — AB (ref 15–37)
Anion Gap: 2 — ABNORMAL LOW (ref 7–16)
BUN: 11 mg/dL (ref 7–18)
Bilirubin,Total: 0.7 mg/dL (ref 0.2–1.0)
CALCIUM: 8.3 mg/dL — AB (ref 8.5–10.1)
CREATININE: 0.89 mg/dL (ref 0.60–1.30)
Chloride: 102 mmol/L (ref 98–107)
Co2: 33 mmol/L — ABNORMAL HIGH (ref 21–32)
EGFR (African American): >60
EGFR (Non-African Amer.): >60
Glucose: 164 mg/dL — ABNORMAL HIGH (ref 65–99)
Osmolality: 277 (ref 275–301)
Potassium: 4.1 mmol/L (ref 3.5–5.1)
Sodium: 137 mmol/L (ref 136–145)
TOTAL PROTEIN: 5.5 g/dL — AB (ref 6.4–8.2)

## 2013-04-08 LAB — CBC WITH DIFFERENTIAL/PLATELET
BASOS PCT: 0.8 %
Basophil #: 0.1 10*3/uL (ref 0.0–0.1)
EOS ABS: 0.3 10*3/uL (ref 0.0–0.7)
Eosinophil %: 3.1 %
HCT: 34 % — AB (ref 40.0–52.0)
HGB: 11.5 g/dL — AB (ref 13.0–18.0)
LYMPHS ABS: 1 10*3/uL (ref 1.0–3.6)
Lymphocyte %: 11 %
MCH: 32 pg (ref 26.0–34.0)
MCHC: 33.9 g/dL (ref 32.0–36.0)
MCV: 95 fL (ref 80–100)
Monocyte #: 0.7 x10 3/mm (ref 0.2–1.0)
Monocyte %: 7.7 %
NEUTROS ABS: 7.1 10*3/uL — AB (ref 1.4–6.5)
Neutrophil %: 77.4 %
Platelet: 324 10*3/uL (ref 150–440)
RBC: 3.6 10*6/uL — ABNORMAL LOW (ref 4.40–5.90)
RDW: 14 % (ref 11.5–14.5)
WBC: 9.2 10*3/uL (ref 3.8–10.6)

## 2013-04-08 LAB — LIPASE, BLOOD: LIPASE: 134 U/L (ref 73–393)

## 2013-04-09 LAB — PATHOLOGY REPORT

## 2013-04-10 ENCOUNTER — Encounter: Payer: Self-pay | Admitting: General Surgery

## 2013-04-15 ENCOUNTER — Ambulatory Visit: Payer: Self-pay | Admitting: Oncology

## 2013-04-15 LAB — MDC_IDC_ENUM_SESS_TYPE_REMOTE
Brady Statistic AP VP Percent: 1 %
Brady Statistic AP VS Percent: 3.6 %
Brady Statistic AS VP Percent: 1 %
Brady Statistic RA Percent Paced: 2.9 %
Implantable Pulse Generator Serial Number: 7412730
Lead Channel Impedance Value: 640 Ohm
Lead Channel Pacing Threshold Amplitude: 0.625 V
Lead Channel Pacing Threshold Pulse Width: 0.4 ms
Lead Channel Pacing Threshold Pulse Width: 0.5 ms
Lead Channel Setting Pacing Amplitude: 1.875
Lead Channel Setting Pacing Pulse Width: 0.4 ms
Lead Channel Setting Sensing Sensitivity: 1.5 mV
MDC IDC MSMT BATTERY REMAINING LONGEVITY: 103 mo
MDC IDC MSMT BATTERY VOLTAGE: 2.96 V
MDC IDC MSMT LEADCHNL RA IMPEDANCE VALUE: 350 Ohm
MDC IDC MSMT LEADCHNL RA PACING THRESHOLD AMPLITUDE: 0.875 V
MDC IDC MSMT LEADCHNL RA SENSING INTR AMPL: 2.1 mV
MDC IDC MSMT LEADCHNL RV SENSING INTR AMPL: 7.7 mV
MDC IDC SESS DTM: 20150225223919
MDC IDC SET LEADCHNL RV PACING AMPLITUDE: 0.875
MDC IDC STAT BRADY AS VS PERCENT: 95 %
MDC IDC STAT BRADY RV PERCENT PACED: 1 %

## 2013-04-18 ENCOUNTER — Ambulatory Visit (INDEPENDENT_AMBULATORY_CARE_PROVIDER_SITE_OTHER): Payer: Self-pay | Admitting: General Surgery

## 2013-04-18 ENCOUNTER — Encounter: Payer: Self-pay | Admitting: General Surgery

## 2013-04-18 VITALS — BP 124/70 | HR 84 | Resp 16 | Ht 71.0 in | Wt 209.0 lb

## 2013-04-18 DIAGNOSIS — K8000 Calculus of gallbladder with acute cholecystitis without obstruction: Secondary | ICD-10-CM

## 2013-04-18 NOTE — Progress Notes (Signed)
This is an 80 year old male here today for his post op open cholecystectomy done on 04/04/13. Patient states he is doing well.  Incision looks clean and healing well. Lung are clear,abdomen soft. Good bowel sounds.

## 2013-04-18 NOTE — Patient Instructions (Addendum)
Patient to return in one month. Activity limited to non exertional.

## 2013-05-06 ENCOUNTER — Encounter: Payer: Medicare Other | Admitting: *Deleted

## 2013-05-06 ENCOUNTER — Encounter: Payer: Self-pay | Admitting: *Deleted

## 2013-05-14 ENCOUNTER — Encounter: Payer: Self-pay | Admitting: Internal Medicine

## 2013-05-21 ENCOUNTER — Ambulatory Visit (INDEPENDENT_AMBULATORY_CARE_PROVIDER_SITE_OTHER): Payer: Self-pay | Admitting: General Surgery

## 2013-05-21 ENCOUNTER — Encounter: Payer: Self-pay | Admitting: General Surgery

## 2013-05-21 VITALS — BP 116/64 | HR 94 | Resp 16 | Ht 73.0 in | Wt 213.0 lb

## 2013-05-21 DIAGNOSIS — K8000 Calculus of gallbladder with acute cholecystitis without obstruction: Secondary | ICD-10-CM

## 2013-05-21 NOTE — Patient Instructions (Addendum)
The patient is aware to call back for any questions or concerns. Follow up as needed. 

## 2013-05-21 NOTE — Progress Notes (Signed)
Patient ID: [name], male   DOB: February 13, 1931, 80 y.o.   MRN: [id number]  Chief Complaint  Patient presents with  . Routine Post Op    HPI [name] is a 80 y.o. male.  here today for one month follow up from open cholecystectomy 04-04-13. States he is doing well. Bowels move daily. States he fell yesterday but it was related to his prothesis.  HPI  Past Medical History  Diagnosis Date  . Hypertension   . Cancer     prostate 2007  . Headache(784.0)     migraines w/o HA; [place]  . Multiple lung nodules 01/2012  . Pacemaker-St.Jude 12/22/2011  . Atrioventricular block, complete 12/21/2011    Past Surgical History  Procedure Laterality Date  . Joint replacement      both R & L replacement;  then R AKA  . Prostatectomy  2007  . Leg amputation above knee  2009    R leg  . Hernia repair      inguinal hernia  . Insert / replace / remove pacemaker  12/2011    St. Jude  . Cholecystectomy  04/04/13    open    History reviewed. No pertinent family history.  Social History History  Substance Use Topics  . Smoking status: Former Smoker    Types: Cigarettes    Quit date: 02/14/1978  . Smokeless tobacco: Former Systems developer    Quit date: 02/14/1978  . Alcohol Use: No    No Known Allergies  Current Outpatient Prescriptions  Medication Sig Dispense Refill  . atenolol (TENORMIN) 25 MG tablet Take 25 mg by mouth daily.      Marland Kitchen atorvastatin (LIPITOR) 10 MG tablet Take 10 mg by mouth daily.       Marland Kitchen imipramine (TOFRANIL) 25 MG tablet Take 50 mg by mouth every morning.      . Multiple Vitamin (MULTIVITAMIN WITH MINERALS) TABS Take 1 tablet by mouth 2 (two) times daily.      . NON FORMULARY Hormone injection every 4 months.      . Red Yeast Rice Extract (RED YEAST RICE PO) Take by mouth 2 (two) times daily.       No current facility-administered medications for this visit.    Review of Systems Review of Systems  Constitutional: Negative.   Respiratory: Negative.   Cardiovascular:  Negative.   Gastrointestinal: Negative for vomiting, diarrhea, constipation and blood in stool.    Blood pressure 116/64, pulse 94, resp. rate 16, height 6\' 1"  (1.854 m), weight 213 lb (96.616 kg).  Physical Exam Physical Exam  Constitutional: He is oriented to person, place, and time. He appears well-developed and well-nourished.  Eyes: Conjunctivae are normal. No scleral icterus.  Neck: Neck supple.  Cardiovascular: Normal rate, regular rhythm and normal heart sounds.   Pulmonary/Chest: Effort normal and breath sounds normal.  Abdominal: Soft. Normal appearance. There is no tenderness.  Right subcostal incision well healed.  Lymphadenopathy:    He has no cervical adenopathy.  Neurological: He is alert and oriented to person, place, and time.  Skin: Skin is warm and dry.    Data Reviewed none  Assessment    Stable physical exam.    Plan    Follow up as needed.       SANKAR,SEEPLAPUTHUR G 05/21/2013, 10:09 AM

## 2013-07-09 ENCOUNTER — Ambulatory Visit: Payer: Self-pay | Admitting: Oncology

## 2013-07-09 LAB — CBC CANCER CENTER
BASOS ABS: 0.1 x10 3/mm (ref 0.0–0.1)
BASOS PCT: 0.9 %
Eosinophil #: 0.1 x10 3/mm (ref 0.0–0.7)
Eosinophil %: 1.8 %
HCT: 45.1 % (ref 40.0–52.0)
HGB: 15 g/dL (ref 13.0–18.0)
LYMPHS ABS: 1.8 x10 3/mm (ref 1.0–3.6)
LYMPHS PCT: 23.4 %
MCH: 31.2 pg (ref 26.0–34.0)
MCHC: 33.4 g/dL (ref 32.0–36.0)
MCV: 94 fL (ref 80–100)
MONO ABS: 0.6 x10 3/mm (ref 0.2–1.0)
Monocyte %: 8.4 %
Neutrophil #: 5.1 x10 3/mm (ref 1.4–6.5)
Neutrophil %: 65.5 %
Platelet: 187 x10 3/mm (ref 150–440)
RBC: 4.82 10*6/uL (ref 4.40–5.90)
RDW: 13.7 % (ref 11.5–14.5)
WBC: 7.7 x10 3/mm (ref 3.8–10.6)

## 2013-07-09 LAB — BASIC METABOLIC PANEL
Anion Gap: 6 — ABNORMAL LOW (ref 7–16)
BUN: 21 mg/dL — ABNORMAL HIGH (ref 7–18)
CALCIUM: 9.1 mg/dL (ref 8.5–10.1)
Chloride: 106 mmol/L (ref 98–107)
Co2: 33 mmol/L — ABNORMAL HIGH (ref 21–32)
Creatinine: 1.03 mg/dL (ref 0.60–1.30)
EGFR (African American): >60
EGFR (Non-African Amer.): >60
Glucose: 95 mg/dL (ref 65–99)
Osmolality: 291 (ref 275–301)
Potassium: 5 mmol/L (ref 3.5–5.1)
SODIUM: 145 mmol/L (ref 136–145)

## 2013-07-10 LAB — PSA: PSA: 0.2 ng/mL (ref 0.0–4.0)

## 2013-07-15 ENCOUNTER — Ambulatory Visit: Payer: Self-pay | Admitting: Oncology

## 2013-07-16 ENCOUNTER — Ambulatory Visit (INDEPENDENT_AMBULATORY_CARE_PROVIDER_SITE_OTHER): Payer: Medicare Other | Admitting: *Deleted

## 2013-07-16 DIAGNOSIS — I442 Atrioventricular block, complete: Secondary | ICD-10-CM

## 2013-07-16 NOTE — Progress Notes (Signed)
Remote pacemaker transmission.   

## 2013-07-17 LAB — MDC_IDC_ENUM_SESS_TYPE_REMOTE
Brady Statistic AP VP Percent: 1 %
Brady Statistic AP VS Percent: 2.4 %
Brady Statistic AS VP Percent: 1 %
Brady Statistic AS VS Percent: 97 %
Brady Statistic RV Percent Paced: 1 %
Implantable Pulse Generator Serial Number: 7412730
Lead Channel Impedance Value: 690 Ohm
Lead Channel Pacing Threshold Amplitude: 0.625 V
Lead Channel Pacing Threshold Pulse Width: 0.4 ms
Lead Channel Pacing Threshold Pulse Width: 0.5 ms
Lead Channel Sensing Intrinsic Amplitude: 12 mV
Lead Channel Setting Pacing Amplitude: 1.875
Lead Channel Setting Pacing Pulse Width: 0.4 ms
Lead Channel Setting Sensing Sensitivity: 1.5 mV
MDC IDC MSMT BATTERY REMAINING LONGEVITY: 107 mo
MDC IDC MSMT BATTERY REMAINING PERCENTAGE: 82 %
MDC IDC MSMT BATTERY VOLTAGE: 2.96 V
MDC IDC MSMT LEADCHNL RA IMPEDANCE VALUE: 360 Ohm
MDC IDC MSMT LEADCHNL RA PACING THRESHOLD AMPLITUDE: 0.875 V
MDC IDC MSMT LEADCHNL RA SENSING INTR AMPL: 2.1 mV
MDC IDC SESS DTM: 20150602070735
MDC IDC SET LEADCHNL RV PACING AMPLITUDE: 0.875
MDC IDC STAT BRADY RA PERCENT PACED: 1.8 %

## 2013-08-13 ENCOUNTER — Encounter: Payer: Self-pay | Admitting: Cardiology

## 2013-08-14 ENCOUNTER — Encounter: Payer: Self-pay | Admitting: Internal Medicine

## 2013-10-16 ENCOUNTER — Ambulatory Visit (INDEPENDENT_AMBULATORY_CARE_PROVIDER_SITE_OTHER): Payer: Medicare Other | Admitting: *Deleted

## 2013-10-16 DIAGNOSIS — I441 Atrioventricular block, second degree: Secondary | ICD-10-CM

## 2013-10-16 NOTE — Progress Notes (Signed)
Remote pacemaker transmission.   

## 2013-10-18 LAB — MDC_IDC_ENUM_SESS_TYPE_REMOTE
Battery Remaining Longevity: 107 mo
Brady Statistic AP VP Percent: 1 %
Brady Statistic AP VS Percent: 2.4 %
Brady Statistic AS VP Percent: 1 %
Brady Statistic AS VS Percent: 97 %
Brady Statistic RA Percent Paced: 1.8 %
Brady Statistic RV Percent Paced: 1 %
Lead Channel Impedance Value: 700 Ohm
Lead Channel Pacing Threshold Amplitude: 0.625 V
Lead Channel Pacing Threshold Amplitude: 0.875 V
Lead Channel Pacing Threshold Pulse Width: 0.5 ms
Lead Channel Sensing Intrinsic Amplitude: 9.7 mV
Lead Channel Setting Pacing Amplitude: 0.875
Lead Channel Setting Pacing Amplitude: 1.875
Lead Channel Setting Pacing Pulse Width: 0.4 ms
Lead Channel Setting Sensing Sensitivity: 1.5 mV
MDC IDC MSMT BATTERY REMAINING PERCENTAGE: 82 %
MDC IDC MSMT BATTERY VOLTAGE: 2.96 V
MDC IDC MSMT LEADCHNL RA IMPEDANCE VALUE: 360 Ohm
MDC IDC MSMT LEADCHNL RA SENSING INTR AMPL: 3.1 mV
MDC IDC MSMT LEADCHNL RV PACING THRESHOLD PULSEWIDTH: 0.4 ms
MDC IDC PG SERIAL: 7412730
MDC IDC SESS DTM: 20150902074541

## 2013-10-24 ENCOUNTER — Encounter: Payer: Self-pay | Admitting: Cardiology

## 2013-11-06 ENCOUNTER — Encounter: Payer: Self-pay | Admitting: Internal Medicine

## 2013-11-11 ENCOUNTER — Ambulatory Visit: Payer: Self-pay | Admitting: Oncology

## 2013-11-11 LAB — CBC CANCER CENTER
Basophil #: 0.1 x10 3/mm (ref 0.0–0.1)
Basophil %: 1.3 %
EOS ABS: 0.3 x10 3/mm (ref 0.0–0.7)
Eosinophil %: 4.1 %
HCT: 42.8 % (ref 40.0–52.0)
HGB: 14.3 g/dL (ref 13.0–18.0)
Lymphocyte #: 1.5 x10 3/mm (ref 1.0–3.6)
Lymphocyte %: 23.2 %
MCH: 31.5 pg (ref 26.0–34.0)
MCHC: 33.3 g/dL (ref 32.0–36.0)
MCV: 94 fL (ref 80–100)
Monocyte #: 0.6 x10 3/mm (ref 0.2–1.0)
Monocyte %: 9.2 %
NEUTROS PCT: 62.2 %
Neutrophil #: 3.9 x10 3/mm (ref 1.4–6.5)
Platelet: 166 x10 3/mm (ref 150–440)
RBC: 4.54 10*6/uL (ref 4.40–5.90)
RDW: 13 % (ref 11.5–14.5)
WBC: 6.3 x10 3/mm (ref 3.8–10.6)

## 2013-11-11 LAB — BASIC METABOLIC PANEL
Anion Gap: 6 — ABNORMAL LOW (ref 7–16)
BUN: 18 mg/dL (ref 7–18)
CO2: 28 mmol/L (ref 21–32)
Calcium, Total: 9.3 mg/dL (ref 8.5–10.1)
Chloride: 102 mmol/L (ref 98–107)
Creatinine: 1.02 mg/dL (ref 0.60–1.30)
EGFR (African American): >60
EGFR (Non-African Amer.): >60
Glucose: 137 mg/dL — ABNORMAL HIGH (ref 65–99)
Osmolality: 276 (ref 275–301)
POTASSIUM: 4.2 mmol/L (ref 3.5–5.1)
SODIUM: 136 mmol/L (ref 136–145)

## 2013-11-13 LAB — PSA: PSA: 0.4 ng/mL (ref 0.0–4.0)

## 2013-11-14 ENCOUNTER — Ambulatory Visit: Payer: Self-pay | Admitting: Oncology

## 2014-01-07 ENCOUNTER — Encounter: Payer: Self-pay | Admitting: Internal Medicine

## 2014-01-07 ENCOUNTER — Ambulatory Visit (INDEPENDENT_AMBULATORY_CARE_PROVIDER_SITE_OTHER): Payer: Medicare Other | Admitting: Internal Medicine

## 2014-01-07 VITALS — BP 123/80 | HR 87 | Ht 74.0 in | Wt 219.2 lb

## 2014-01-07 DIAGNOSIS — R059 Cough, unspecified: Secondary | ICD-10-CM

## 2014-01-07 DIAGNOSIS — I471 Supraventricular tachycardia: Secondary | ICD-10-CM

## 2014-01-07 DIAGNOSIS — Z45018 Encounter for adjustment and management of other part of cardiac pacemaker: Secondary | ICD-10-CM

## 2014-01-07 DIAGNOSIS — C7800 Secondary malignant neoplasm of unspecified lung: Secondary | ICD-10-CM

## 2014-01-07 DIAGNOSIS — I442 Atrioventricular block, complete: Secondary | ICD-10-CM

## 2014-01-07 DIAGNOSIS — R05 Cough: Secondary | ICD-10-CM

## 2014-01-07 DIAGNOSIS — R079 Chest pain, unspecified: Secondary | ICD-10-CM

## 2014-01-07 DIAGNOSIS — C61 Malignant neoplasm of prostate: Secondary | ICD-10-CM

## 2014-01-07 LAB — MDC_IDC_ENUM_SESS_TYPE_INCLINIC
Battery Remaining Longevity: 134.4 mo
Battery Voltage: 2.96 V
Brady Statistic RA Percent Paced: 1.9 %
Brady Statistic RV Percent Paced: 0.39 %
Date Time Interrogation Session: 20151124100845
Implantable Pulse Generator Model: 2210
Implantable Pulse Generator Serial Number: 7412730
Lead Channel Impedance Value: 350 Ohm
Lead Channel Pacing Threshold Amplitude: 0.5 V
Lead Channel Pacing Threshold Amplitude: 0.875 V
Lead Channel Pacing Threshold Pulse Width: 0.4 ms
Lead Channel Sensing Intrinsic Amplitude: 8.8 mV
Lead Channel Setting Pacing Amplitude: 0.75 V
Lead Channel Setting Pacing Amplitude: 1.875
Lead Channel Setting Pacing Pulse Width: 0.4 ms
Lead Channel Setting Sensing Sensitivity: 1.5 mV
MDC IDC MSMT LEADCHNL RA PACING THRESHOLD PULSEWIDTH: 0.5 ms
MDC IDC MSMT LEADCHNL RA SENSING INTR AMPL: 1.9 mV
MDC IDC MSMT LEADCHNL RV IMPEDANCE VALUE: 700 Ohm

## 2014-01-07 NOTE — Patient Instructions (Signed)
Remote monitoring is used to monitor your Pacemaker of ICD from home. This monitoring reduces the number of office visits required to check your device to one time per year. It allows [place] to keep an eye on the functioning of your device to ensure it is working properly. You are scheduled for a device check from home on 04/10/14. You may send your transmission at any time that day. If you have a wireless device, the transmission will be sent automatically. After your physician reviews your transmission, you will receive a postcard with your next transmission date.  Your physician wants you to follow-up in: 9 months with Dr. Caryl Comes. You will receive a reminder letter in the mail two months in advance. If you don't receive a letter, please call our office to schedule the follow-up appointment.  Your physician recommends that you continue on your current medications as directed. Please refer to the Current Medication list given to you today.

## 2014-01-07 NOTE — Progress Notes (Signed)
      Patient Care Team: Albina Billet, MD as PCP - General (Unknown Physician Specialty) Christene Lye, MD (General Surgery)   HPI  [name] is a 80 y.o. male Seen in followup for PM implanted 11/13 for syncope and intermittent complete heart block   A chest CT confirmed numerous bilateral pulmonary nodules highly suspicious for metastatic dz. He underwent biopsy>>metastatic prostate Ca and on hormone therapy   He has a dry hacking cough with some drainage. He has had no sinus congestion   Patient has prior AKA and employs the use of a walker for ambulation   Past Medical History  Diagnosis Date  . Hypertension   . Cancer     prostate 2007  . Headache(784.0)     migraines w/o HA; [place]  . Multiple lung nodules 01/2012  . Pacemaker-St.Jude 12/22/2011  . Atrioventricular block, complete 12/21/2011    Past Surgical History  Procedure Laterality Date  . Joint replacement      both R & L replacement;  then R AKA  . Prostatectomy  2007  . Leg amputation above knee  2009    R leg  . Hernia repair      inguinal hernia  . Insert / replace / remove pacemaker  12/2011    St. Jude  . Cholecystectomy  04/04/13    open    Current Outpatient Prescriptions  Medication Sig Dispense Refill  . atenolol (TENORMIN) 25 MG tablet Take 25 mg by mouth daily.    Marland Kitchen atorvastatin (LIPITOR) 10 MG tablet Take 10 mg by mouth daily.     Marland Kitchen imipramine (TOFRANIL) 25 MG tablet Take 50 mg by mouth every morning.    . Multiple Vitamin (MULTIVITAMIN WITH MINERALS) TABS Take 1 tablet by mouth 2 (two) times daily.    . NON FORMULARY Hormone injection every 4 months.    . Red Yeast Rice Extract (RED YEAST RICE PO) Take by mouth 2 (two) times daily.     No current facility-administered medications for this visit.    No Known Allergies  Review of Systems negative except from HPI and PMH  Physical Exam BP 123/80 mmHg  Pulse 87  Ht 6\' 2"  (1.88 m)  Wt 99.451 kg (219 lb 4 oz)  BMI 28.14  kg/m2 Well developed and well nourished in no acute distress HENT normal E scleral and icterus clear Neck Supple JVP flat; carotids brisk and full Clear to ausculation Device pocket well healed; without hematoma or erythema.  There is no tethering Regular rate and rhythm,2/6 murmuer with split s2. gallops or rub Soft with active bowel sounds No clubbing cyanosis  No  Edema status post right lower leg amputation Alert and oriented, grossly normal motor and sensory function Skin Warm mildly diaphoretic   ECG demonstrates sinus rhythm with frequent PACs Intervals 22/14/44 Axis left -60 Right bundle branch block  Assessment and  Plan  Syncope  Pacemaker St. Jude  Atrial ectopy  Cough  Prostate cancer with pulmonary metastases  The patient has had no recurrent syncope. His pacemaker function is normal. The device was reprogrammed.  He has frequent atrial ectopyNonsustained atrial tachycardia these episodes have all been less than 1 minute duration hence there is no indication for anticoagulation  As relates to his cough I've asked him to follow-up with his PCP or his oncologist as the evaluation we rather complicated in the context of pulmonary metastatic disease.

## 2014-01-23 ENCOUNTER — Encounter (HOSPITAL_COMMUNITY): Payer: Self-pay | Admitting: Internal Medicine

## 2014-03-13 ENCOUNTER — Ambulatory Visit: Payer: Self-pay | Admitting: Oncology

## 2014-03-13 LAB — BASIC METABOLIC PANEL
ANION GAP: 9 (ref 7–16)
BUN: 20 mg/dL — AB (ref 7–18)
CO2: 30 mmol/L (ref 21–32)
CREATININE: 0.96 mg/dL (ref 0.60–1.30)
Calcium, Total: 8.9 mg/dL (ref 8.5–10.1)
Chloride: 106 mmol/L (ref 98–107)
GLUCOSE: 112 mg/dL — AB (ref 65–99)
Osmolality: 292 (ref 275–301)
Potassium: 4.7 mmol/L (ref 3.5–5.1)
Sodium: 145 mmol/L (ref 136–145)

## 2014-03-13 LAB — CBC CANCER CENTER
Basophil #: 0.1 x10 3/mm (ref 0.0–0.1)
Basophil %: 1.3 %
EOS ABS: 0.2 x10 3/mm (ref 0.0–0.7)
Eosinophil %: 2.6 %
HCT: 44.2 % (ref 40.0–52.0)
HGB: 14.9 g/dL (ref 13.0–18.0)
Lymphocyte #: 1.4 x10 3/mm (ref 1.0–3.6)
Lymphocyte %: 21.8 %
MCH: 31.5 pg (ref 26.0–34.0)
MCHC: 33.8 g/dL (ref 32.0–36.0)
MCV: 93 fL (ref 80–100)
Monocyte #: 0.6 x10 3/mm (ref 0.2–1.0)
Monocyte %: 8.7 %
Neutrophil #: 4.3 x10 3/mm (ref 1.4–6.5)
Neutrophil %: 65.6 %
Platelet: 156 x10 3/mm (ref 150–440)
RBC: 4.75 10*6/uL (ref 4.40–5.90)
RDW: 13.2 % (ref 11.5–14.5)
WBC: 6.5 x10 3/mm (ref 3.8–10.6)

## 2014-03-14 LAB — PSA: PSA: 0.7 ng/mL (ref 0.0–4.0)

## 2014-03-17 ENCOUNTER — Ambulatory Visit: Payer: Self-pay | Admitting: Oncology

## 2014-03-20 ENCOUNTER — Encounter: Payer: Self-pay | Admitting: Internal Medicine

## 2014-04-10 ENCOUNTER — Ambulatory Visit (INDEPENDENT_AMBULATORY_CARE_PROVIDER_SITE_OTHER): Payer: Medicare Other | Admitting: *Deleted

## 2014-04-10 DIAGNOSIS — I442 Atrioventricular block, complete: Secondary | ICD-10-CM

## 2014-04-10 NOTE — Progress Notes (Signed)
Remote pacemaker transmission.   

## 2014-04-11 LAB — MDC_IDC_ENUM_SESS_TYPE_REMOTE
Battery Remaining Longevity: 107 mo
Battery Remaining Percentage: 82 %
Battery Voltage: 2.96 V
Brady Statistic AP VP Percent: 1 %
Brady Statistic AP VS Percent: 2.7 %
Brady Statistic AS VS Percent: 97 %
Brady Statistic RV Percent Paced: 1 %
Date Time Interrogation Session: 20160225070009
Implantable Pulse Generator Model: 2210
Implantable Pulse Generator Serial Number: 7412730
Lead Channel Impedance Value: 340 Ohm
Lead Channel Impedance Value: 650 Ohm
Lead Channel Pacing Threshold Amplitude: 0.625 V
Lead Channel Pacing Threshold Amplitude: 0.875 V
Lead Channel Pacing Threshold Pulse Width: 0.4 ms
Lead Channel Pacing Threshold Pulse Width: 0.5 ms
Lead Channel Sensing Intrinsic Amplitude: 2.4 mV
Lead Channel Setting Pacing Amplitude: 1.875
MDC IDC MSMT LEADCHNL RV SENSING INTR AMPL: 8.1 mV
MDC IDC SET LEADCHNL RV PACING AMPLITUDE: 0.875
MDC IDC SET LEADCHNL RV PACING PULSEWIDTH: 0.4 ms
MDC IDC SET LEADCHNL RV SENSING SENSITIVITY: 1.5 mV
MDC IDC STAT BRADY AS VP PERCENT: 1 %
MDC IDC STAT BRADY RA PERCENT PACED: 2 %

## 2014-04-23 ENCOUNTER — Encounter: Payer: Self-pay | Admitting: *Deleted

## 2014-04-30 ENCOUNTER — Encounter: Payer: Self-pay | Admitting: Internal Medicine

## 2014-05-16 ENCOUNTER — Ambulatory Visit
Admit: 2014-05-16 | Disposition: A | Discharge status: Home / Self Care | Payer: Self-pay | Attending: Oncology | Admitting: Oncology

## 2014-05-16 LAB — BASIC METABOLIC PANEL
ANION GAP: 4 — AB (ref 7–16)
BUN: 23 mg/dL — AB
CHLORIDE: 104 mmol/L
Calcium, Total: 9.2 mg/dL
Co2: 28 mmol/L
Creatinine: 0.87 mg/dL
EGFR (Non-African Amer.): >60
Glucose: 90 mg/dL
Potassium: 4.6 mmol/L
Sodium: 136 mmol/L

## 2014-05-17 LAB — PSA: PSA: 0.8 ng/mL (ref 0.0–4.0)

## 2014-05-19 ENCOUNTER — Ambulatory Visit
Admit: 2014-05-19 | Disposition: A | Discharge status: Home / Self Care | Payer: Self-pay | Attending: Oncology | Admitting: Oncology

## 2014-06-03 NOTE — Discharge Summary (Signed)
PATIENT NAME:  [name], [name] MR#:  [id number] DATE OF BIRTH:  1930-09-21  DATE OF ADMISSION:  12/20/2011 DATE OF DISCHARGE:  12/20/2011   TYPE OF DISCHARGE: Transfer to [facility] from the ED.   REASON FOR TRANSFER:  1. Syncope with collapse.  2. Likely sick sinus syndrome with AV dissociation.  3. Hypertension.  4. Hyperlipidemia.  5. Osteoarthritis.   CONSULTANT: Dr. Rockey Situ    BRIEF HOSPITAL COURSE: The patient is [name] 81 year old white male who presented to the ED with four episodes of "blanking out". The patient while in the ED had another episode where he became unresponsive, became very cyanotic, and was in asystole. The patient was also having frequent causes and frequent PVCs. He was seen by Dr. Rockey Situ who recommends that he be transferred to [facility]. Arrangements are currently being made for him to go to [facility] for EP evaluation and likely pacemaker. The patient currently is stable and further treatment and disposition as per [facility].   PERTINENT LABS AND EVALUATIONS: Glucose 117, BUN 18, creatinine 0.98, sodium 141, potassium 4.3, chloride 105, CO2 28. Magnesium 2.2. LFTs were normal. Troponin less than 0.02. WBC 8.2, hemoglobin 16.6, platelet count 171.  CT scan of the head did show an old lacunar infarct in the anterior limb of the right internal capsule.       The patient was not admitted to the hospital. He was evaluated in the ED and transferred to [facility].   ____________________________ Lafonda Mosses. Posey Pronto, MD shp:drc D: 12/20/2011 17:41:33 ET T: 12/20/2011 17:51:54 ET JOB#: [id number]  cc: Pieper Kasik H. Posey Pronto, MD, <Dictator> Leona Carry. Hall Busing, MD Alric Seton MD ELECTRONICALLY SIGNED 12/26/2011 8:27

## 2014-06-03 NOTE — H&P (Signed)
PATIENT NAME:  Ruben Ruben, Ruben Ruben MR#:  [id number] DATE OF BIRTH:  12-21-1930  DATE OF ADMISSION:  12/20/2011  PRIMARY CARE PROVIDER: Dr. Benita [name]   CARDIOLOGIST: None.   CHIEF COMPLAINT: Syncopal episodes.   HISTORY OF PRESENT ILLNESS: The patient is Ruben 81 year old old white male with history of hypertension, osteoarthritis, and hyperlipidemia who was doing well up until yesterday when he went to eat at Ruben Ruben where he was not responding for Ruben few seconds. His eyes were closed and initially started having blurred vision. He states that he tried to shake it off by shaking his head and then there were people sitting by who tried to shake him and then he asked him what happened and they said that he was out for Ruben few seconds. The patient went home and then at midnight his wife reports that again he jumped up and his body was shaking. When she shook him to wake him up the patient stated that he had Ruben nightmare. He also had another episode. While in the ED he's had Ruben few episodes where he becomes very pale. On the monitor we were unable to appreciate any rhythm. He also is hooked up to the monitor currently and he was having pauses. He is also having frequent PVCs. The patient denies any chest pain or shortness of breath. No similar symptoms in the past. Denies any fevers, chills. No dyspnea on exertion. No orthopnea.   PAST MEDICAL HISTORY:  1. History of hypertension.  2. History of prostate cancer.  3. Osteoarthritis.  4. Hyperlipidemia.   PAST SURGICAL HISTORY:  1. Status post right total knee replacement.  2. Status post prostatectomy.  3. Status post cataract surgery.  4. Status post hernia repair.   ALLERGIES: ACE inhibitors and statins.   CURRENT MEDICATIONS: The patient is on:  1. Atenolol 25 p.o. daily.  2. Atorvastatin 10 mg at bedtime.  3. Imipramine 25 two 2 tabs daily.  4. Multivitamin daily.   SOCIAL HISTORY: Does not smoke. Does not drink. No drugs.   FAMILY  HISTORY: Positive for hypertension.  REVIEW OF SYSTEMS: CONSTITUTIONAL: Denies any fevers. Complains of fatigue and weakness with these episodes. No pain. No weight loss. No weight gain. EYES: No blurred or double vision. No pain. No redness. No inflammation. No glaucoma. No cataracts. ENT: No tinnitus. No ear pain. No hearing loss. No seasonal or year round allergies. No difficulty swallowing. RESPIRATORY: Denies any cough, wheezing, or hemoptysis. No COPD. No TB. No pneumonia. CARDIOVASCULAR: Denies any chest pain, orthopnea. No edema. Syncope as above. GI: No nausea, vomiting, diarrhea. No abdominal pain. No hematemesis. No melena. No gastroesophageal reflux disease. No irritable bowel syndrome. No jaundice. GU: Denies any dysuria, hematuria, renal calculus, or frequency. ENDOCRINE: Denies any polyuria, nocturia, or thyroid problems. HEME/LYMPH: Denies anemia, easy bruisability, or bleeding. MUSCULOSKELETAL: Denies any pain in the neck, back, or shoulder. NEUROLOGIC: No numbness. No CVA. No TIA. No seizures. PSYCHIATRIC: No anxiety. No insomnia. No ADD. No OCD.   PHYSICAL EXAMINATION:   VITAL SIGNS: Temperature 98.2, pulse 102, respirations 18, blood pressure 155/90, O2 94%.   GENERAL: The patient is Ruben well developed male currently not in acute distress. However, also during my evaluation the patient became pale and cyanotic for Ruben few seconds. We were not able to get any rhythm on the monitor.   HEENT: Head atraumatic, normocephalic. Pupils equally round, reactive to light and accommodation. There is no conjunctival pallor. No scleral icterus. Nasal exam shows  no drainage or ulceration. Oropharynx is clear without any exudate.   NECK: No thyromegaly. No carotid bruits.   CARDIOVASCULAR: Regular rate and rhythm. No murmurs, rubs, clicks, or gallops. PMI is not displaced.   LUNGS: Clear to auscultation bilaterally without any rales, rhonchi, or wheezing.   ABDOMEN: Soft, nontender, nondistended.  Positive bowel sounds x4.   EXTREMITIES: No clubbing, cyanosis, or edema.   SKIN: No rash.   LYMPHATICS: No lymph nodes palpable.   VASCULAR: Good DP, PT pulses.   PSYCHIATRIC: Not anxious or depressed.   NEUROLOGICAL: Awake, alert, oriented x3. No focal deficits.   EVALUATIONS: EKG showed sinus rhythm with frequent PVCs. On his rhythm monitor he's had frequent PVCs with few second brief episode of pauses. Unfortunately, when he had the initial episode he had no rhythm on the monitor. It was not recorded.   CT scan of the head shows no evidence of acute abnormalities. There is an old lacunar infarct in the anterior limb of the right internal capsule.   WBC 8.2, hemoglobin 16.6, platelet count 117, glucose 117, BUN 18, creatinine 0.98, sodium 141, potassium 4.3, chloride 105, CO2 25. LFTs are normal. Troponin less than 0.02.   ASSESSMENT AND PLAN: The patient is Ruben 81 year old old white male who presents with four episodes of syncope. He had two more episodes in the ED, was thought to have possible asystole. He has been having some pauses as well.  1. Syncope with collapse likely due to possible Ruben-V dissociation, likely needs Ruben pacemaker. Dr. Rockey [name] of Cardiology is currently evaluating him. He is debating whether to transfer him to [facility]. He is trying to make arrangements. If he is admitted here, he will be placed in the CCU with external pacemaker pads. He may also get Ruben temporary pacemaker per Dr. Rockey [name]. If he's here, will also get an echocardiogram of his heart.  2. Hypertension. Will hold his atenolol in light of #1.  3. Hyperlipidemia. Continue simvastatin.  4. Miscellaneous. Will use heparin for DVT prophylaxis in light of possible pacemaker placement.    TIME SPENT: 45 minutes.   ____________________________ Ruben Mosses Posey Pronto, MD shp:drc D: 12/20/2011 17:14:03 ET T: 12/20/2011 17:42:25 ET JOB#: [id number]  cc: Ruben Dehart H. Posey Pronto, MD, <Dictator> Ruben Carry. Hall Busing, MD Ruben Seton MD ELECTRONICALLY SIGNED 12/26/2011 8:27

## 2014-06-03 NOTE — Consult Note (Signed)
General Aspect 81 yo male with PMH of PVD (legs) with total knee replacement, complications of wound healing requiring amputation on the righ (prothesis in place), HTN, otherwise in good health with epsiodes of near syncope, syncope starting yeaterday. Cardiology was consulted for AV heart block/arrhythmia.  he reports having an epsiode yesterday, one overnight, four episodes today. The epsiode in the ER at [facility] was the worst. On tele, he had documented at least 45 sec epsiode of AV block. P wave noted with rate 100 bpm, no ventricuilar activity. He required chest compressions in the ER until rhythm came back on its own. He recovered after this epsiode and now back to baseline. 6 epsiodes total in past 24 hours.   Social: remoted smoking hx 35 yr ago, retired.  Family hx: No significant CAD hx   Physical Exam:   GEN well developed, well nourished, no acute distress    HEENT red conjunctivae    NECK supple  No masses    RESP normal resp effort  clear BS    CARD Regular rate and rhythm  No murmur    ABD denies tenderness  soft    SKIN normal to palpation    NEURO motor/sensory function intact    PSYCH alert, A+O to time, place, person, good insight   Review of Systems:   Subjective/Chief Complaint syncoper episodes    General: "pass out spells"    Skin: No Complaints    ENT: No Complaints    Eyes: No Complaints    Neck: No Complaints    Respiratory: No Complaints    Cardiovascular: No Complaints    Gastrointestinal: No Complaints    Genitourinary: No Complaints    Vascular: No Complaints    Musculoskeletal: No Complaints    Neurologic: pass out spells    Hematologic: No Complaints    Endocrine: No Complaints    Psychiatric: No Complaints    Review of Systems: All other systems were reviewed and found to be negative    Medications/Allergies Reviewed Medications/Allergies reviewed     celluilitis:    HTN:    Prostate Cancer:    Arthritis:     right total knee replacement: 12-Sep-2007   prostatectomy Aug 2008:    high cholesterol:    Cataract Extraction:    Hernia Repair:    Total Knee Replacement: 1969       Admit Diagnosis:   [facility]: 20-Dec-2011, Active, [facility]  Home Medications: Medication Instructions Status  atenolol 25 mg oral tablet 1 tab(s) orally once a day  Active  atorvastatin 10 mg oral tablet 1 tab(s) orally once a day (at bedtime) Active  multivitamin 1 tab(s) orally once a day Active  imipramine 25 mg oral tablet 2 tab(s) orally once a day Active   Lab Results: Hepatic:  05-Nov-13 13:49    Bilirubin, Total 0.6   Alkaline Phosphatase 62   SGPT (ALT) 33   SGOT (AST) 16   Total Protein, Serum 7.3   Albumin, Serum 3.9  Routine Chem:  05-Nov-13 13:49    Magnesium, Serum 2.2 (1.8-2.4 THERAPEUTIC RANGE: 4-7 mg/dL TOXIC: > 10 mg/dL  -----------------------)   Glucose, Serum  117   BUN 18   Creatinine (comp) 0.98   Sodium, Serum 141   Potassium, Serum 4.3   Chloride, Serum 105   CO2, Serum 28   Calcium (Total), Serum 9.5   Osmolality (calc) 284   eGFR (African American) >60   eGFR (Non-African American) >60 (eGFR values <  24m/min/1.73 m2 may be an indication of chronic kidney disease (CKD). Calculated eGFR is useful in patients with stable renal function. The eGFR calculation will not be reliable in acutely ill patients when serum creatinine is changing rapidly. It is not useful in  patients on dialysis. The eGFR calculation may not be applicable to patients at the low and high extremes of body sizes, pregnant women, and vegetarians.)   Anion Gap 8  Cardiac:  05-Nov-13 13:49    Troponin I < 0.02 (0.00-0.05 0.05 ng/mL or less: NEGATIVE  Repeat testing in 3-6 hrs  if clinically indicated. >0.05 ng/mL: POTENTIAL  MYOCARDIAL INJURY. Repeat  testing in 3-6 hrs if  clinically indicated. NOTE: An increase or decrease  of 30% or more on serial  testing suggests a   clinically important change)  Routine Hem:  05-Nov-13 13:49    WBC (CBC) 8.2   RBC (CBC) 5.04   Hemoglobin (CBC) 16.6   Hematocrit (CBC) 47.9   Platelet Count (CBC) 171 (Result(s) reported on 20 Dec 2011 at 02:18PM.)   MCV 95   MCH 33.0   MCHC 34.7   RDW 13.1   EKG:   Interpretation NSR with no significant ST or T wave changes Tele showing 45 sec strip of AV block, no ventricular escape rhythm    Ace Inhibitors: Cough  Statins: Unknown    Impression 81yo male with PMH of PVD (legs) with total knee replacement, complications of wound healing requiring amputation on the righ (prothesis in place), HTN, otherwise in good health with epsiodes of near syncope, syncope starting yeaterday. Cardiology was consulted for AV heart block/arrhythmia.  1) AV block tele showing sinus rhythm with no ventricular activity x approx 45 seconds Requiring chest compressions --Case discussed with Dr. TLovena Leat [facility] LLCGiven urgent nature of his rhythm and need for pacemaker, [facility] to safely place a pacing wire (no fluro) to stabilize patient, [facility] --he has a CCU bed available and [facility] has been contacted for transport. --External pacing pads in place.  --Difficulty printing tele strip in the ER documenting arrhythmia   Electronic Signatures: GIda Rogue(MD)  (Signed [id number])  Authored: General Aspect/Present Illness, History and Physical Exam, Review of System, Past Medical History, Health Issues, Home Medications, Labs, EKG , Allergies, Impression/Plan   Last Updated: 05-Nov-13 17:51 by GIda Rogue(MD)

## 2014-06-07 NOTE — Discharge Summary (Signed)
PATIENT NAME:  [name], [name] MR#:  [id number] DATE OF BIRTH:  Jun 09, 1930  DATE OF ADMISSION:  04/04/2013 DATE OF DISCHARGE:  04/10/2013  HISTORY OF PRESENT ILLNESS:  This is an 81 year old male, who is status post right above-knee amputation who presented with several days' history of significant right upper abdominal pain. He initially started off with the symptoms of constipation with some mild discomfort that was not well localized, about 5 to 6 days prior to admission. Over the last couple of days prior to admission, the patient experienced persistent and increasing pain in the right upper quadrant area of the abdomen. He had experienced difficulty in moving, coughing and breathing because of the pain. He denied any nausea or vomiting. No associated fever or chills.   PAST MEDICAL HISTORY: In addition to his above-knee amputation, which was necessitated due to complications associated with knee surgery, included history of prostate cancer treated with hormonal therapy and prior prostatectomy. He has had previous inguinal hernia repair and removal of colonic polyps. He has history of hypertension under control with beta blocker   PHYSICAL EXAMINATION: At time of admission revealed moderate to severe tenderness in the right upper quadrant with guarding. Examination otherwise was unremarkable. He was afebrile and vital signs were in normal range.   LABORATORY DATA:  Showed [name] white count of 20,000, liver functions were all mildly elevated with bilirubin of 1.9.  Other chemistries were normal. Ultrasound revealed the presence of gallstones and sludge, thickening of the gallbladder wall and pericholecystic fluid. Clinical findings and the ultrasound findings were all consistent with acute cholecystitis.   HOSPITAL COURSE: The patient was advised of cholecystectomy and with consent, he was taken to the operating room on 04/04/2013.  At the time of laparoscopy, it was evident that the acute process was fairly  well advanced with hemorrhagic and gangrenous-appearing gallbladder with perforation and free bile floating in the right upper quadrant. In view of this, the procedure was converted to an open technique. After peeling away the adhesions surrounding the gallbladder, the gallbladder was successfully removed.  [name] moderate amount of oozing from the liver bed was controlled with the use of Surgiflo. The patient subsequently was admitted to the hospital. He was maintained on IV antibiotics. Over the next few days, the patient made slow progress. His white returned to normal almost within 24 to 48 hours. He, however, was somewhat slow in his movements because of his prior amputation and has new abdominal discomfort from the incision and required moderate assistance initially.  Physical therapy consultation was obtained.  Initially the patient had poor oral intake, which improved over the next several days and at the time of discharge, he was tolerating his diet well. The physical therapy assessment requested that the patient have some treatment as an outpatient or in home evaluation and treatment. The patient subsequently was discharged in stable condition on 02/25, but 2 to 3 days prior that, the patient had mild tachycardia, up to 110, which gradually returned to about 90s after reinitiation of his atenolol.  He had no symptoms or signs to suggest any other reason for his tachycardia.  With his white count being normal, with no fever, the incision looking clean with no signs of infection, and his abdominal exam being otherwise unremarkable, the patient was subsequently discharged in stable condition on 02/25 to be followed as an outpatient.   FINAL DIAGNOSES: 1.  Acute cholecystitis and cholelithiasis.  2.  Hypertension.  3.  Status post right above-knee amputation.  4.  History of prostate cancer.  5.  History of colon polyps.   OPERATION PERFORMED: Laparoscopy and conversion to an open cholecystectomy     ____________________________ S.Robinette Haines, MD sgs:dp D: 04/13/2013 10:07:03 ET T: 04/13/2013 11:47:59 ET JOB#: [id number]  cc: Synthia Innocent. Jamal Collin, MD, <Dictator> [facility] Robinette Haines MD ELECTRONICALLY SIGNED 04/15/2013 8:16

## 2014-06-07 NOTE — Op Note (Signed)
PATIENT NAME:  [name], [name] MR#:  [id number] DATE OF BIRTH:  1930-04-27  DATE OF PROCEDURE:  04/04/2013  PREOPERATIVE DIAGNOSIS: Acute cholecystitis and cholelithiasis.   POSTOPERATIVE DIAGNOSIS: Gangrenous and hemorrhagic cholecystitis with perforation and cholelithiasis.   OPERATION PERFORMED: Laparoscopy and conversion to an open cholecystectomy.   SURGEON: S.G. Jamal Collin, M.D.   ANESTHESIA: General.   COMPLICATIONS: None.   ESTIMATED BLOOD LOSS: Approximately 50 mL.   DRAINS: Blake drain.   DESCRIPTION OF PROCEDURE: The patient was put to sleep in the supine position on the operating table. The abdomen was prepped and draped out as [name] sterile field. Incision was made at the umbilicus and [name] Veress needle with the InnerDyne sleeve was positioned in the peritoneal cavity and pneumoperitoneum was obtained. [name] 10 mm port was then positioned and subsequently an epigastric 5 mm port. The right upper quadrant area was completely draped over by the transverse colon, hepatic flexure and omentum all stuck to the liver and gallbladder was not visible; however, what was visible was [name] moderate amount of dark bile in the portion to the right of the liver. This was [name] suggestive of [name] perforation of the gallbladder. This was suctioned out. An attempt was made to mobilize the adhesions around the gallbladder, but this was very firmly adherent and would not give. At this point, it was elected to proceed with open technique. Ports were removed. [name] standard right subcostal incision was made, which was deepened through the layers, anterior sheath and the rectus muscle were all cut with cautery. The posterior sheath and peritoneum were opened. On entering the abdominal cavity, the gallbladder area was exposed and the surrounding adhesion of the entire hepatic flexure of the colon omentum etc. was peeled away from the gallbladder with blunt traction. The gallbladder was noted to be gangrenous and hemorrhagic in nature with [name]  focal area of perforation. After suctioning out all the fluid, the gallbladder was then exposed satisfactorily with retraction. The cystic duct was identified and noted that even the cystic duct had some small patches of gangrenous areas.  Further attempts were not made to do [name] cholangiogram. The cystic duct was freed to [name] point where it appeared more normal where it was hemoclipped and then cut. The cystic artery was identified. It was hemoclipped and cut. The gallbladder was then bluntly dissected off the gallbladder bed with [name] moderate amount of edema in this region and some oozing from here. It was then noted to contain some tiny stones in addition to dark thick bile. It was sent in formalin for pathology. The area was irrigated with some saline. [name] Blake drain was then positioned on the right subcostal area. [name] small amount of oozing from 1 aspect of the liver bed was covered with Surgicel and controlled. The Blake drain was fastened to the skin with [name] nylon stitch. Sponges and instruments were removed. The posterior sheath and peritoneum were closed with [name] running 0 Vicryl stitch. The wound was irrigated. The anterior sheath was closed with interrupted figure-of-eight stitches of 0 Maxon. Subcutaneous tissue was irrigated and closed with 3-0 Vicryl and the skin was closed with staples. Two small port sites were also closed with staples. Dry sterile dressing was placed. The patient tolerated the procedure well. There were no immediate problems encountered. He was extubated and returned to the recovery room in stable condition.   ____________________________ S.Robinette Haines, MD sgs:aw D: 04/05/2013 11:08:18 ET T: 04/05/2013 11:32:48 ET JOB#: [id number]  cc: S.G. Jamal Collin, MD, <  Dictator> [facility] Robinette Haines MD ELECTRONICALLY SIGNED 04/13/2013 10:03

## 2014-06-07 NOTE — H&P (Signed)
PATIENT NAME:  [name], [name] MR#:  [id number] DATE OF BIRTH:  1930/03/31  DATE OF ADMISSION:  04/04/2013  REASON FOR ADMISSION: The patient is being admitted via the ER for surgery later today.   HISTORY OF PRESENT ILLNESS: This is an 81 year old male who presents with at least [name] 48 hour history of some significant right upper abdominal pain. Apparently he started having symptoms of constipation with some discomfort that was not well localized about 5 days ago and over the last couple of days, even though his constipation was [name] little better, he has been experiencing more persistent pain in the right side of the abdomen. It was getting to the point where it hurts to move around. Taking deep breaths or coughing causes pain. He has had no nausea or vomiting associated. He denies any previous episodes of abdominal pain like this.   PAST MEDICAL HISTORY: Includes multiple issues. He is [name] right above-knee amputee. He has hypertension and prostate cancer which has been treated with hormonal therapy every 4 months. Previous prostatectomy, inguinal hernia repairs, and colonic polyps.  MEDICATIONS:  Currently include imipramine, atorvastatin, and atenolol. From [name] medical standpoint, he has been relatively stable.   REVIEW OF SYSTEMS: The patient in addition to his abdominal pain has not had any nausea or vomiting. There are no fever or chills.  No history of chest pain or shortness of breath. No dysuria.  PHYSICAL EXAMINATION: GENERAL: The patient is not in any acute distress.  EYES: Sclerae have very mild icteric tinge. Conjunctiva is normal.  ENT: Tongue is moist, pink, and clear.  NECK: Supple. No nodes or masses palpable.  LUNGS: Clear to auscultation and percussion.  HEART: Sinus rhythm without any murmurs.  ABDOMEN: There is moderate tenderness in the upper abdomen on the right with some voluntary guarding. There is just [name] little palpable fullness suggestive of [name] distended gallbladder. Bowel sounds  are active. No hernia is identified. EXTREMITIES: He is [name] right above-knee amputee with [name] well-healed stump. Left leg with no edema or other acute changes.   DIAGNOSTIC DATA: Laboratory data: White count is elevated at 20,000. Liver functions are all mildly elevated with [name] bilirubin of 1.9. His chemistries otherwise look normal.   Ultrasound was performed showing the presence of gallstones and sludge and thickening of the gallbladder wall with pericholecystic fluid.   IMPRESSION: The patient has all the features suggestive of acute cholecystitis. I discussed surgical management with the patient.   PLAN: The patient is agreeable to laparoscopy and cholecystectomy. He is aware of the potential problems including possible need for an open cholecystectomy. We will plan on doing this later in the day today, and the patient will be placed as observation or regular admission based on findings thereafter.  ____________________________ S.Robinette Haines, MD sgs:sb D: 04/04/2013 12:06:39 ET T: 04/04/2013 12:24:46 ET JOB#: [id number]  cc:   Doyne Keel MD ELECTRONICALLY SIGNED 04/05/2013 11:05

## 2014-07-02 ENCOUNTER — Other Ambulatory Visit: Payer: Self-pay | Admitting: Oncology

## 2014-07-02 ENCOUNTER — Other Ambulatory Visit: Payer: Self-pay | Admitting: *Deleted

## 2014-07-02 DIAGNOSIS — R918 Other nonspecific abnormal finding of lung field: Secondary | ICD-10-CM

## 2014-07-03 ENCOUNTER — Encounter (INDEPENDENT_AMBULATORY_CARE_PROVIDER_SITE_OTHER): Payer: Self-pay

## 2014-07-03 ENCOUNTER — Inpatient Hospital Stay: Payer: Medicare Other | Attending: Oncology | Admitting: Oncology

## 2014-07-03 ENCOUNTER — Inpatient Hospital Stay: Payer: Medicare Other

## 2014-07-03 ENCOUNTER — Other Ambulatory Visit: Payer: Self-pay | Admitting: Oncology

## 2014-07-03 VITALS — BP 128/75 | HR 74 | Temp 97.5°F | Resp 20 | Wt 213.4 lb

## 2014-07-03 DIAGNOSIS — R531 Weakness: Secondary | ICD-10-CM | POA: Diagnosis not present

## 2014-07-03 DIAGNOSIS — I10 Essential (primary) hypertension: Secondary | ICD-10-CM | POA: Insufficient documentation

## 2014-07-03 DIAGNOSIS — Z79899 Other long term (current) drug therapy: Secondary | ICD-10-CM

## 2014-07-03 DIAGNOSIS — C61 Malignant neoplasm of prostate: Secondary | ICD-10-CM | POA: Insufficient documentation

## 2014-07-03 DIAGNOSIS — C7800 Secondary malignant neoplasm of unspecified lung: Secondary | ICD-10-CM | POA: Insufficient documentation

## 2014-07-03 DIAGNOSIS — R5383 Other fatigue: Secondary | ICD-10-CM | POA: Diagnosis not present

## 2014-07-03 DIAGNOSIS — R918 Other nonspecific abnormal finding of lung field: Secondary | ICD-10-CM

## 2014-07-03 DIAGNOSIS — Z79818 Long term (current) use of other agents affecting estrogen receptors and estrogen levels: Secondary | ICD-10-CM

## 2014-07-03 LAB — CBC WITH DIFFERENTIAL/PLATELET
BASOS ABS: 0.1 10*3/uL (ref 0–0.1)
Basophils Relative: 1 %
EOS ABS: 0.2 10*3/uL (ref 0–0.7)
Eosinophils Relative: 3 %
HCT: 43.4 % (ref 40.0–52.0)
Hemoglobin: 14.6 g/dL (ref 13.0–18.0)
Lymphocytes Relative: 23 %
Lymphs Abs: 1.3 10*3/uL (ref 1.0–3.6)
MCH: 31.5 pg (ref 26.0–34.0)
MCHC: 33.6 g/dL (ref 32.0–36.0)
MCV: 93.7 fL (ref 80.0–100.0)
Monocytes Absolute: 0.5 10*3/uL (ref 0.2–1.0)
Monocytes Relative: 9 %
Neutro Abs: 3.7 10*3/uL (ref 1.4–6.5)
Neutrophils Relative %: 64 %
PLATELETS: 171 10*3/uL (ref 150–440)
RBC: 4.63 MIL/uL (ref 4.40–5.90)
RDW: 13.2 % (ref 11.5–14.5)
WBC: 5.8 10*3/uL (ref 3.8–10.6)

## 2014-07-03 LAB — COMPREHENSIVE METABOLIC PANEL
ALBUMIN: 4.3 g/dL (ref 3.5–5.0)
ALT: 21 U/L (ref 17–63)
ANION GAP: 5 (ref 5–15)
AST: 24 U/L (ref 15–41)
Alkaline Phosphatase: 47 U/L (ref 38–126)
BILIRUBIN TOTAL: 0.9 mg/dL (ref 0.3–1.2)
BUN: 21 mg/dL — AB (ref 6–20)
CALCIUM: 9.2 mg/dL (ref 8.9–10.3)
CHLORIDE: 103 mmol/L (ref 101–111)
CO2: 28 mmol/L (ref 22–32)
CREATININE: 0.9 mg/dL (ref 0.61–1.24)
GFR calc Af Amer: >60 mL/min (ref >60)
Glucose, Bld: 129 mg/dL — ABNORMAL HIGH (ref 65–99)
Potassium: 4.3 mmol/L (ref 3.5–5.1)
Sodium: 136 mmol/L (ref 135–145)
Total Protein: 6.8 g/dL (ref 6.5–8.1)

## 2014-07-03 LAB — PSA: PSA: 0.06 ng/mL (ref 0.00–4.00)

## 2014-07-03 LAB — MAGNESIUM: Magnesium: 2.2 mg/dL (ref 1.7–2.4)

## 2014-07-03 NOTE — Progress Notes (Signed)
[place]  [phone] [phone]  ID: [name] OB: [birth year]  MR#: [id number]  KGM#:[id number]  Patient Care Team: Albina Billet, MD as PCP - General (Unknown Physician Specialty) Christene Lye, MD (General Surgery)  CHIEF COMPLAINT:  Chief Complaint  Patient presents with  . Follow-up    prostate cancer, recently started on Xtandi    INTERVAL HISTORY: Patient returns to clinic today for further evaluation and to assess his toleration of Xtandi. He has some increased fatigue, but otherwise is tolerating it well. He has occasional hot flashes, but no other no neurologic complaints.  He denies any chest pain or shortness of breath.  He has a good appetite and denies weight loss.  He denies any nausea, vomiting, constipation, or diarrhea.  He has no urinary complaints.  Patient offers no further specific complaints today.  REVIEW OF SYSTEMS:   Review of Systems  Constitutional: Positive for malaise/fatigue.  Musculoskeletal: Negative.     As per HPI. Otherwise, a complete review of systems is negatve.  PAST MEDICAL HISTORY: Past Medical History  Diagnosis Date  . Hypertension   . Cancer     prostate 2007  . Headache(784.0)     migraines w/o HA; [place]  . Multiple lung nodules 01/2012  . Pacemaker-St.Jude 12/22/2011  . Atrioventricular block, complete 12/21/2011    PAST SURGICAL HISTORY: Past Surgical History  Procedure Laterality Date  . Joint replacement      both R & L replacement;  then R AKA  . Prostatectomy  2007  . Leg amputation above knee  2009    R leg  . Hernia repair      inguinal hernia  . Insert / replace / remove pacemaker  12/2011    St. Jude  . Cholecystectomy  04/04/13    open  . Permanent pacemaker insertion N/A 12/21/2011    Procedure: PERMANENT PACEMAKER INSERTION;  Surgeon: Thompson Grayer, MD;  Location: [facility] CATH LAB;  Service: Cardiovascular;  Laterality: N/A;    FAMILY HISTORY: Breast  cancer.     ADVANCED DIRECTIVES:    HEALTH MAINTENANCE: History  Substance Use Topics  . Smoking status: Former Smoker    Types: Cigarettes    Quit date: 02/14/1978  . Smokeless tobacco: Former Systems developer    Quit date: 02/14/1978  . Alcohol Use: No     Colonoscopy:  PAP:  Bone density:  Lipid panel:  No Known Allergies  Current Outpatient Prescriptions  Medication Sig Dispense Refill  . atenolol (TENORMIN) 25 MG tablet Take 25 mg by mouth daily.    Marland Kitchen atorvastatin (LIPITOR) 10 MG tablet Take 10 mg by mouth daily.     . enzalutamide (XTANDI) 40 MG capsule Take 160 mg by mouth daily.    Marland Kitchen imipramine (TOFRANIL) 25 MG tablet Take 50 mg by mouth every morning.    . Multiple Vitamin (MULTIVITAMIN WITH MINERALS) TABS Take 1 tablet by mouth 2 (two) times daily.    . NON FORMULARY Hormone injection every 4 months.    . Red Yeast Rice Extract (RED YEAST RICE PO) Take by mouth 2 (two) times daily.     No current facility-administered medications for this visit.    OBJECTIVE: Filed Vitals:   07/03/14 1051  BP: 128/75  Pulse: 74  Temp: 97.5 F (36.4 C)  Resp: 20     Body mass index is 27.39 kg/(m^2).    ECOG FS: 1  General: Well-developed, well-nourished, no acute distress. Sitting in a  wheelchair. Eyes: anicteric sclera. Lungs: Clear to auscultation bilaterally. Heart: Regular rate and rhythm. No rubs, murmurs, or gallops. Abdomen: Soft, nontender, nondistended. No organomegaly noted, normoactive bowel sounds. Musculoskeletal: No edema, cyanosis, or clubbing. Neuro: Alert, answering all questions appropriately. Cranial nerves grossly intact. Skin: No rashes or petechiae noted. Psych: Normal affect.    LAB RESULTS:  Lab Results  Component Value Date   NA 136 07/03/2014   K 4.3 07/03/2014   CL 103 07/03/2014   CO2 28 07/03/2014   GLUCOSE 129* 07/03/2014   BUN 21* 07/03/2014   CREATININE 0.90 07/03/2014   CALCIUM 9.2 07/03/2014   PROT 6.8 07/03/2014   ALBUMIN 4.3  07/03/2014   AST 24 07/03/2014   ALT 21 07/03/2014   ALKPHOS 47 07/03/2014   BILITOT 0.9 07/03/2014   GFRNONAA >60 07/03/2014   GFRAA >60 07/03/2014    Lab Results  Component Value Date   WBC 5.8 07/03/2014   NEUTROABS 3.7 07/03/2014   HGB 14.6 07/03/2014   HCT 43.4 07/03/2014   MCV 93.7 07/03/2014   PLT 171 07/03/2014     STUDIES: No results found.  ASSESSMENT: Stage IV prostate cancer with biopsy confirmed metastasis to lung.  PLAN:    1.  Prostate cancer: Lung nodules previously confirmed positive for prostate cancer by biopsy.  PSA is pending from today. Continue 160 mg oral Xtandi daily. Return to clinic in 3 months with repeat laboratory work and further evaluation. Plan to reimage in approximately 6 months. He expressed understanding and was in agreement with this plan. 2. Weakness/fatigue: Likely multifactorial, monitor.   Patient expressed understanding and was in agreement with this plan. He also understands that He can call clinic at any time with any questions, concerns, or complaints.   No matching staging information was found for the patient.  Lloyd Huger, MD   07/03/2014 10:58 AM

## 2014-07-08 ENCOUNTER — Telehealth: Payer: Self-pay | Admitting: *Deleted

## 2014-07-08 NOTE — Telephone Encounter (Signed)
I spoke with patient to inform him of psa results.

## 2014-07-10 ENCOUNTER — Ambulatory Visit (INDEPENDENT_AMBULATORY_CARE_PROVIDER_SITE_OTHER): Payer: Medicare Other | Admitting: *Deleted

## 2014-07-10 DIAGNOSIS — I442 Atrioventricular block, complete: Secondary | ICD-10-CM | POA: Diagnosis not present

## 2014-07-10 NOTE — Progress Notes (Signed)
Remote pacemaker transmission.   

## 2014-07-18 LAB — CUP PACEART REMOTE DEVICE CHECK
Battery Remaining Longevity: 106 mo
Battery Voltage: 2.96 V
Brady Statistic RV Percent Paced: 1 %
Date Time Interrogation Session: 20160526060011
Lead Channel Impedance Value: 330 Ohm
Lead Channel Impedance Value: 660 Ohm
Lead Channel Pacing Threshold Amplitude: 0.625 V
Lead Channel Pacing Threshold Amplitude: 1 V
Lead Channel Pacing Threshold Pulse Width: 0.5 ms
Lead Channel Sensing Intrinsic Amplitude: 2 mV
Lead Channel Sensing Intrinsic Amplitude: 8.7 mV
Lead Channel Setting Pacing Amplitude: 0.875
Lead Channel Setting Pacing Amplitude: 2 V
Lead Channel Setting Sensing Sensitivity: 1.5 mV
MDC IDC MSMT BATTERY REMAINING PERCENTAGE: 82 %
MDC IDC MSMT LEADCHNL RV PACING THRESHOLD PULSEWIDTH: 0.4 ms
MDC IDC SET LEADCHNL RV PACING PULSEWIDTH: 0.4 ms
MDC IDC STAT BRADY AP VP PERCENT: 1 %
MDC IDC STAT BRADY AP VS PERCENT: 12 %
MDC IDC STAT BRADY AS VP PERCENT: 1 %
MDC IDC STAT BRADY AS VS PERCENT: 87 %
MDC IDC STAT BRADY RA PERCENT PACED: 10 %
Pulse Gen Serial Number: 7412730

## 2014-08-06 ENCOUNTER — Encounter: Payer: Self-pay | Admitting: Cardiology

## 2014-08-11 ENCOUNTER — Encounter: Payer: Self-pay | Admitting: Internal Medicine

## 2014-10-09 ENCOUNTER — Ambulatory Visit (INDEPENDENT_AMBULATORY_CARE_PROVIDER_SITE_OTHER): Payer: Medicare Other | Admitting: *Deleted

## 2014-10-09 ENCOUNTER — Other Ambulatory Visit: Payer: Medicare Other

## 2014-10-09 ENCOUNTER — Ambulatory Visit: Payer: Medicare Other | Admitting: Oncology

## 2014-10-09 ENCOUNTER — Telehealth: Payer: Self-pay | Admitting: Cardiology

## 2014-10-09 ENCOUNTER — Telehealth: Payer: Self-pay | Admitting: Internal Medicine

## 2014-10-09 DIAGNOSIS — I441 Atrioventricular block, second degree: Secondary | ICD-10-CM

## 2014-10-09 NOTE — Telephone Encounter (Signed)
Pt made aware that transmission was received at 3:53am today.

## 2014-10-09 NOTE — Telephone Encounter (Signed)
Spoke with pt and reminded pt of remote transmission that is due today. Pt verbalized understanding.   

## 2014-10-09 NOTE — Telephone Encounter (Signed)
New message     Pt is wanting to know if transmission has been received Please call to discuss

## 2014-10-14 ENCOUNTER — Encounter: Payer: Self-pay | Admitting: Cardiology

## 2014-10-16 ENCOUNTER — Inpatient Hospital Stay: Payer: Medicare Other | Attending: Oncology

## 2014-10-16 ENCOUNTER — Inpatient Hospital Stay (HOSPITAL_BASED_OUTPATIENT_CLINIC_OR_DEPARTMENT_OTHER): Payer: Medicare Other | Admitting: Oncology

## 2014-10-16 VITALS — BP 132/76 | HR 65 | Temp 96.3°F | Resp 16 | Wt 210.8 lb

## 2014-10-16 DIAGNOSIS — C7800 Secondary malignant neoplasm of unspecified lung: Secondary | ICD-10-CM

## 2014-10-16 DIAGNOSIS — R531 Weakness: Secondary | ICD-10-CM

## 2014-10-16 DIAGNOSIS — C61 Malignant neoplasm of prostate: Secondary | ICD-10-CM | POA: Diagnosis present

## 2014-10-16 DIAGNOSIS — Z79899 Other long term (current) drug therapy: Secondary | ICD-10-CM

## 2014-10-16 DIAGNOSIS — I10 Essential (primary) hypertension: Secondary | ICD-10-CM | POA: Insufficient documentation

## 2014-10-16 DIAGNOSIS — Z950 Presence of cardiac pacemaker: Secondary | ICD-10-CM

## 2014-10-16 DIAGNOSIS — Z87891 Personal history of nicotine dependence: Secondary | ICD-10-CM | POA: Diagnosis not present

## 2014-10-16 DIAGNOSIS — Z8669 Personal history of other diseases of the nervous system and sense organs: Secondary | ICD-10-CM | POA: Insufficient documentation

## 2014-10-16 DIAGNOSIS — R5383 Other fatigue: Secondary | ICD-10-CM

## 2014-10-16 LAB — CBC WITH DIFFERENTIAL/PLATELET
BASOS PCT: 1 %
Basophils Absolute: 0.1 10*3/uL (ref 0–0.1)
EOS ABS: 0.2 10*3/uL (ref 0–0.7)
Eosinophils Relative: 3 %
HCT: 43.1 % (ref 40.0–52.0)
HEMOGLOBIN: 14.9 g/dL (ref 13.0–18.0)
LYMPHS ABS: 1.3 10*3/uL (ref 1.0–3.6)
Lymphocytes Relative: 20 %
MCH: 32.8 pg (ref 26.0–34.0)
MCHC: 34.6 g/dL (ref 32.0–36.0)
MCV: 94.8 fL (ref 80.0–100.0)
Monocytes Absolute: 0.6 10*3/uL (ref 0.2–1.0)
Monocytes Relative: 9 %
NEUTROS PCT: 67 %
Neutro Abs: 4.4 10*3/uL (ref 1.4–6.5)
Platelets: 182 10*3/uL (ref 150–440)
RBC: 4.55 MIL/uL (ref 4.40–5.90)
RDW: 12.8 % (ref 11.5–14.5)
WBC: 6.6 10*3/uL (ref 3.8–10.6)

## 2014-10-16 LAB — COMPREHENSIVE METABOLIC PANEL
ALT: 19 U/L (ref 17–63)
AST: 25 U/L (ref 15–41)
Albumin: 4.1 g/dL (ref 3.5–5.0)
Alkaline Phosphatase: 50 U/L (ref 38–126)
Anion gap: 3 — ABNORMAL LOW (ref 5–15)
BUN: 23 mg/dL — AB (ref 6–20)
CO2: 28 mmol/L (ref 22–32)
Calcium: 8.8 mg/dL — ABNORMAL LOW (ref 8.9–10.3)
Chloride: 105 mmol/L (ref 101–111)
Creatinine, Ser: 0.92 mg/dL (ref 0.61–1.24)
GFR calc Af Amer: >60 mL/min (ref >60)
GFR calc non Af Amer: >60 mL/min (ref >60)
Glucose, Bld: 121 mg/dL — ABNORMAL HIGH (ref 65–99)
Potassium: 4.7 mmol/L (ref 3.5–5.1)
SODIUM: 136 mmol/L (ref 135–145)
Total Bilirubin: 0.8 mg/dL (ref 0.3–1.2)
Total Protein: 6.6 g/dL (ref 6.5–8.1)

## 2014-10-16 LAB — MAGNESIUM: Magnesium: 2.1 mg/dL (ref 1.7–2.4)

## 2014-10-16 LAB — PSA: PSA: <0.01 ng/mL (ref 0.00–4.00)

## 2014-10-16 NOTE — Progress Notes (Signed)
Patient repots his weakness seems to be worsening.

## 2014-10-20 NOTE — Progress Notes (Signed)
[place]  Telephone:(336) [phone] Fax:(336) [phone]  ID: [name] OB: [birth year]  MR#: [id number]  OHY#:[id number]  Patient Care Team: Albina Billet, MD as PCP - General (Unknown Physician Specialty) Christene Lye, MD (General Surgery)  CHIEF COMPLAINT:  Chief Complaint  Patient presents with  . Follow-up    prostate cancer metastatic to lung    INTERVAL HISTORY: Patient returns to clinic today for further evaluation and to assess his toleration of Xtandi. He has some increased fatigue, but otherwise is tolerating it well. He does not complain of hot flashes today. has occasional hot flashes, but no other no neurologic complaints.  He denies any chest pain or shortness of breath.  He has a good appetite and denies weight loss.  He denies any nausea, vomiting, constipation, or diarrhea.  He has no urinary complaints.  Patient offers no further specific complaints today.  REVIEW OF SYSTEMS:   Review of Systems  Constitutional: Positive for malaise/fatigue.  Musculoskeletal: Negative.     As per HPI. Otherwise, a complete review of systems is negatve.  PAST MEDICAL HISTORY: Past Medical History  Diagnosis Date  . Hypertension   . Cancer     prostate 2007  . Headache(784.0)     migraines w/o HA; auras  . Multiple lung nodules 01/2012  . Pacemaker-St.Jude 12/22/2011  . Atrioventricular block, complete 12/21/2011    PAST SURGICAL HISTORY: Past Surgical History  Procedure Laterality Date  . Joint replacement      both R & L replacement;  then R AKA  . Prostatectomy  2007  . Leg amputation above knee  2009    R leg  . Hernia repair      inguinal hernia  . Insert / replace / remove pacemaker  12/2011    St. Jude  . Cholecystectomy  04/04/13    open  . Permanent pacemaker insertion N/A 12/21/2011    Procedure: PERMANENT PACEMAKER INSERTION;  Surgeon: Thompson Grayer, MD;  Location: [facility] CATH LAB;  Service: Cardiovascular;  Laterality: N/A;     FAMILY HISTORY: Breast cancer.     ADVANCED DIRECTIVES:    HEALTH MAINTENANCE: Social History  Substance Use Topics  . Smoking status: Former Smoker    Types: Cigarettes    Quit date: 02/14/1978  . Smokeless tobacco: Former Systems developer    Quit date: 02/14/1978  . Alcohol Use: No     Colonoscopy:  PAP:  Bone density:  Lipid panel:  Allergies  Allergen Reactions  . Ace Inhibitors Cough  . Statins Other (See Comments)    CK elevation    Current Outpatient Prescriptions  Medication Sig Dispense Refill  . atenolol (TENORMIN) 25 MG tablet Take 25 mg by mouth daily.    Marland Kitchen atorvastatin (LIPITOR) 10 MG tablet Take 10 mg by mouth daily.     . enzalutamide (XTANDI) 40 MG capsule Take 160 mg by mouth daily.    Marland Kitchen imipramine (TOFRANIL) 25 MG tablet Take 50 mg by mouth every morning.    . Multiple Vitamin (MULTIVITAMIN WITH MINERALS) TABS Take 1 tablet by mouth 2 (two) times daily.    . NON FORMULARY Hormone injection every 4 months.    . Red Yeast Rice Extract (RED YEAST RICE PO) Take by mouth 2 (two) times daily.     No current facility-administered medications for this visit.    OBJECTIVE: Filed Vitals:   10/16/14 1021  BP: 132/76  Pulse: 65  Temp: 96.3 F (35.7 C)  Resp: 16  Body mass index is 27.05 kg/(m^2).    ECOG FS: 1  General: Well-developed, well-nourished, no acute distress. Sitting in a wheelchair. Eyes: anicteric sclera. Lungs: Clear to auscultation bilaterally. Heart: Regular rate and rhythm. No rubs, murmurs, or gallops. Abdomen: Soft, nontender, nondistended. No organomegaly noted, normoactive bowel sounds. Musculoskeletal: No edema, cyanosis, or clubbing. Prosthetic leg noted. Neuro: Alert, answering all questions appropriately. Cranial nerves grossly intact. Skin: No rashes or petechiae noted. Psych: Normal affect.    LAB RESULTS:  Lab Results  Component Value Date   NA 136 10/16/2014   K 4.7 10/16/2014   CL 105 10/16/2014   CO2 28  10/16/2014   GLUCOSE 121* 10/16/2014   BUN 23* 10/16/2014   CREATININE 0.92 10/16/2014   CALCIUM 8.8* 10/16/2014   PROT 6.6 10/16/2014   ALBUMIN 4.1 10/16/2014   AST 25 10/16/2014   ALT 19 10/16/2014   ALKPHOS 50 10/16/2014   BILITOT 0.8 10/16/2014   GFRNONAA >60 10/16/2014   GFRAA >60 10/16/2014    Lab Results  Component Value Date   WBC 6.6 10/16/2014   NEUTROABS 4.4 10/16/2014   HGB 14.9 10/16/2014   HCT 43.1 10/16/2014   MCV 94.8 10/16/2014   PLT 182 10/16/2014     STUDIES: No results found.  ASSESSMENT: Stage IV prostate cancer with biopsy confirmed metastasis to lung.  PLAN:    1.  Prostate cancer: Lung nodules previously confirmed positive for prostate cancer by biopsy.  PSA is is now less than 0.01. Continue 160 mg oral Xtandi daily. Return to clinic in 3 months with repeat imaging, laboratory work, and further evaluation. He expressed understanding and was in agreement with this plan. 2. Weakness/fatigue: Likely multifactorial, monitor.   Patient expressed understanding and was in agreement with this plan. He also understands that He can call clinic at any time with any questions, concerns, or complaints.   No matching staging information was found for the patient.  Lloyd Huger, MD   10/20/2014 11:08 AM

## 2014-10-21 ENCOUNTER — Telehealth: Payer: Self-pay | Admitting: Internal Medicine

## 2014-10-21 NOTE — Telephone Encounter (Signed)
New Message  Pt calling to speak w/ Device. Pt stated he sent in transmission on 8/26- has no-show listed for his appt. Please call back and discuss.

## 2014-10-21 NOTE — Telephone Encounter (Signed)
Informed pt that his transmission was received on 10-09-14 and to disregard letter. Pt verbalized understanding.

## 2014-10-22 NOTE — Progress Notes (Signed)
Remote pacemaker transmission.   

## 2014-10-23 LAB — CUP PACEART REMOTE DEVICE CHECK
Battery Remaining Longevity: 119 mo
Battery Remaining Percentage: 91 %
Brady Statistic AP VP Percent: 1 %
Brady Statistic AP VS Percent: 13 %
Brady Statistic RA Percent Paced: 12 %
Brady Statistic RV Percent Paced: 1 %
Date Time Interrogation Session: 20160825075341
Lead Channel Impedance Value: 390 Ohm
Lead Channel Impedance Value: 700 Ohm
Lead Channel Pacing Threshold Amplitude: 1.125 V
Lead Channel Pacing Threshold Pulse Width: 0.5 ms
Lead Channel Sensing Intrinsic Amplitude: 1.7 mV
Lead Channel Setting Pacing Amplitude: 0.875
Lead Channel Setting Sensing Sensitivity: 1.5 mV
MDC IDC MSMT BATTERY VOLTAGE: 2.95 V
MDC IDC MSMT LEADCHNL RV PACING THRESHOLD AMPLITUDE: 0.625 V
MDC IDC MSMT LEADCHNL RV PACING THRESHOLD PULSEWIDTH: 0.4 ms
MDC IDC MSMT LEADCHNL RV SENSING INTR AMPL: 10.4 mV
MDC IDC SET LEADCHNL RA PACING AMPLITUDE: 2.125
MDC IDC SET LEADCHNL RV PACING PULSEWIDTH: 0.4 ms
MDC IDC STAT BRADY AS VP PERCENT: 1 %
MDC IDC STAT BRADY AS VS PERCENT: 85 %
Pulse Gen Model: 2210
Pulse Gen Serial Number: 7412730

## 2014-10-24 ENCOUNTER — Encounter: Payer: Self-pay | Admitting: Cardiology

## 2014-11-03 ENCOUNTER — Encounter: Payer: Self-pay | Admitting: Internal Medicine

## 2015-01-01 ENCOUNTER — Encounter: Payer: Medicare Other | Admitting: Internal Medicine

## 2015-01-16 ENCOUNTER — Ambulatory Visit
Admission: RE | Admit: 2015-01-16 | Discharge: 2015-01-16 | Disposition: A | Discharge status: Home / Self Care | Payer: Medicare Other | Source: Ambulatory Visit | Attending: Oncology | Admitting: Oncology

## 2015-01-16 ENCOUNTER — Inpatient Hospital Stay: Payer: Medicare Other | Attending: Oncology

## 2015-01-16 DIAGNOSIS — J432 Centrilobular emphysema: Secondary | ICD-10-CM | POA: Insufficient documentation

## 2015-01-16 DIAGNOSIS — R590 Localized enlarged lymph nodes: Secondary | ICD-10-CM | POA: Diagnosis not present

## 2015-01-16 DIAGNOSIS — Z79899 Other long term (current) drug therapy: Secondary | ICD-10-CM | POA: Diagnosis not present

## 2015-01-16 DIAGNOSIS — R5383 Other fatigue: Secondary | ICD-10-CM | POA: Diagnosis not present

## 2015-01-16 DIAGNOSIS — M8580 Other specified disorders of bone density and structure, unspecified site: Secondary | ICD-10-CM | POA: Insufficient documentation

## 2015-01-16 DIAGNOSIS — I442 Atrioventricular block, complete: Secondary | ICD-10-CM | POA: Insufficient documentation

## 2015-01-16 DIAGNOSIS — I700 Atherosclerosis of aorta: Secondary | ICD-10-CM | POA: Diagnosis not present

## 2015-01-16 DIAGNOSIS — C7800 Secondary malignant neoplasm of unspecified lung: Secondary | ICD-10-CM | POA: Insufficient documentation

## 2015-01-16 DIAGNOSIS — J439 Emphysema, unspecified: Secondary | ICD-10-CM | POA: Diagnosis not present

## 2015-01-16 DIAGNOSIS — I10 Essential (primary) hypertension: Secondary | ICD-10-CM | POA: Insufficient documentation

## 2015-01-16 DIAGNOSIS — R531 Weakness: Secondary | ICD-10-CM | POA: Insufficient documentation

## 2015-01-16 DIAGNOSIS — Z87891 Personal history of nicotine dependence: Secondary | ICD-10-CM | POA: Insufficient documentation

## 2015-01-16 DIAGNOSIS — I2510 Atherosclerotic heart disease of native coronary artery without angina pectoris: Secondary | ICD-10-CM | POA: Diagnosis not present

## 2015-01-16 DIAGNOSIS — R918 Other nonspecific abnormal finding of lung field: Secondary | ICD-10-CM | POA: Insufficient documentation

## 2015-01-16 DIAGNOSIS — C61 Malignant neoplasm of prostate: Secondary | ICD-10-CM | POA: Diagnosis present

## 2015-01-16 DIAGNOSIS — R300 Dysuria: Secondary | ICD-10-CM | POA: Insufficient documentation

## 2015-01-16 LAB — COMPREHENSIVE METABOLIC PANEL
ALT: 18 U/L (ref 17–63)
ANION GAP: 9 (ref 5–15)
AST: 22 U/L (ref 15–41)
Albumin: 3.8 g/dL (ref 3.5–5.0)
Alkaline Phosphatase: 55 U/L (ref 38–126)
BILIRUBIN TOTAL: 0.7 mg/dL (ref 0.3–1.2)
BUN: 19 mg/dL (ref 6–20)
CHLORIDE: 101 mmol/L (ref 101–111)
CO2: 25 mmol/L (ref 22–32)
Calcium: 9 mg/dL (ref 8.9–10.3)
Creatinine, Ser: 0.93 mg/dL (ref 0.61–1.24)
GFR calc Af Amer: >60 mL/min (ref >60)
GFR calc non Af Amer: >60 mL/min (ref >60)
GLUCOSE: 141 mg/dL — AB (ref 65–99)
POTASSIUM: 3.9 mmol/L (ref 3.5–5.1)
Sodium: 135 mmol/L (ref 135–145)
TOTAL PROTEIN: 6.4 g/dL — AB (ref 6.5–8.1)

## 2015-01-16 LAB — CBC WITH DIFFERENTIAL/PLATELET
Basophils Absolute: 0.1 10*3/uL (ref 0–0.1)
Basophils Relative: 1 %
Eosinophils Absolute: 0.2 10*3/uL (ref 0–0.7)
Eosinophils Relative: 3 %
HEMATOCRIT: 43 % (ref 40.0–52.0)
Hemoglobin: 14.6 g/dL (ref 13.0–18.0)
LYMPHS ABS: 1.2 10*3/uL (ref 1.0–3.6)
LYMPHS PCT: 21 %
MCH: 32.5 pg (ref 26.0–34.0)
MCHC: 33.9 g/dL (ref 32.0–36.0)
MCV: 95.9 fL (ref 80.0–100.0)
Monocytes Absolute: 0.5 10*3/uL (ref 0.2–1.0)
Monocytes Relative: 8 %
NEUTROS ABS: 4 10*3/uL (ref 1.4–6.5)
Neutrophils Relative %: 67 %
Platelets: 184 10*3/uL (ref 150–440)
RBC: 4.49 MIL/uL (ref 4.40–5.90)
RDW: 12.6 % (ref 11.5–14.5)
WBC: 6 10*3/uL (ref 3.8–10.6)

## 2015-01-16 LAB — PSA: PSA: 0 ng/mL (ref 0.00–4.00)

## 2015-01-16 LAB — POCT I-STAT CREATININE: CREATININE: 0.9 mg/dL (ref 0.61–1.24)

## 2015-01-16 MED ORDER — IOHEXOL 300 MG/ML  SOLN
75.0000 mL | Freq: Once | INTRAMUSCULAR | Status: AC | PRN
  Administered 2015-01-16: 75 mL via INTRAVENOUS

## 2015-01-19 ENCOUNTER — Inpatient Hospital Stay (HOSPITAL_BASED_OUTPATIENT_CLINIC_OR_DEPARTMENT_OTHER): Payer: Medicare Other | Admitting: Oncology

## 2015-01-19 VITALS — BP 118/77 | HR 61 | Temp 96.6°F | Resp 16

## 2015-01-19 DIAGNOSIS — Z87891 Personal history of nicotine dependence: Secondary | ICD-10-CM | POA: Diagnosis not present

## 2015-01-19 DIAGNOSIS — I442 Atrioventricular block, complete: Secondary | ICD-10-CM

## 2015-01-19 DIAGNOSIS — R5383 Other fatigue: Secondary | ICD-10-CM | POA: Diagnosis not present

## 2015-01-19 DIAGNOSIS — J432 Centrilobular emphysema: Secondary | ICD-10-CM

## 2015-01-19 DIAGNOSIS — I10 Essential (primary) hypertension: Secondary | ICD-10-CM | POA: Diagnosis not present

## 2015-01-19 DIAGNOSIS — R300 Dysuria: Secondary | ICD-10-CM | POA: Diagnosis not present

## 2015-01-19 DIAGNOSIS — C7800 Secondary malignant neoplasm of unspecified lung: Secondary | ICD-10-CM

## 2015-01-19 DIAGNOSIS — C61 Malignant neoplasm of prostate: Secondary | ICD-10-CM | POA: Diagnosis not present

## 2015-01-19 DIAGNOSIS — R531 Weakness: Secondary | ICD-10-CM

## 2015-01-19 DIAGNOSIS — R918 Other nonspecific abnormal finding of lung field: Secondary | ICD-10-CM | POA: Diagnosis not present

## 2015-01-19 DIAGNOSIS — R590 Localized enlarged lymph nodes: Secondary | ICD-10-CM | POA: Diagnosis not present

## 2015-01-19 DIAGNOSIS — M8580 Other specified disorders of bone density and structure, unspecified site: Secondary | ICD-10-CM

## 2015-01-19 DIAGNOSIS — Z79899 Other long term (current) drug therapy: Secondary | ICD-10-CM | POA: Diagnosis not present

## 2015-01-19 NOTE — Progress Notes (Signed)
Patient has burning with urination that has been checked by PCP.  When he drinks cranberry juice he does not have the dysuria.  Does feel fatigued but no more than his usual

## 2015-01-20 ENCOUNTER — Encounter: Payer: Self-pay | Admitting: Internal Medicine

## 2015-01-20 ENCOUNTER — Ambulatory Visit (INDEPENDENT_AMBULATORY_CARE_PROVIDER_SITE_OTHER): Payer: Medicare Other | Admitting: Internal Medicine

## 2015-01-20 VITALS — BP 130/60 | HR 66 | Ht 74.0 in | Wt 211.0 lb

## 2015-01-20 DIAGNOSIS — I10 Essential (primary) hypertension: Secondary | ICD-10-CM

## 2015-01-20 DIAGNOSIS — I442 Atrioventricular block, complete: Secondary | ICD-10-CM

## 2015-01-20 NOTE — Patient Instructions (Signed)
Medication Instructions: - no changes  Labwork: - none  Procedures/Testing: - none  Follow-Up: - Remote monitoring is used to monitor your Pacemaker of ICD from home. This monitoring reduces the number of office visits required to check your device to one time per year. It allows [place] to keep an eye on the functioning of your device to ensure it is working properly. You are scheduled for a device check from home on 04/21/15. You may send your transmission at any time that day. If you have a wireless device, the transmission will be sent automatically. After your physician reviews your transmission, you will receive a postcard with your next transmission date.  - Your physician wants you to follow-up in: 1 year with Dr. Caryl Comes. You will receive a reminder letter in the mail two months in advance. If you don't receive a letter, please call our office to schedule the follow-up appointment.  Any Additional Special Instructions Will Be Listed Below (If Applicable).

## 2015-01-20 NOTE — Progress Notes (Signed)
      Patient Care Team: Albina Billet, MD as PCP - General (Unknown Physician Specialty) Christene Lye, MD (General Surgery)   HPI  [name] is a 81 y.o. male Seen in followup for PM implanted 11/13 for syncope and intermittent complete heart block   A chest CT confirmed numerous bilateral pulmonary nodules highly suspicious for metastatic dz. He underwent biopsy>>metastatic prostate Ca and on hormone therapy   He has a dry hacking cough with some drainage. He has had no sinus congestion   Patient has prior AKA and employs the use of a walker for ambulation   Past Medical History  Diagnosis Date  . Hypertension   . Cancer [facility])     prostate 2007  . Headache(784.0)     migraines w/o HA; auras  . Multiple lung nodules 01/2012  . Pacemaker-St.Jude 12/22/2011  . Atrioventricular block, complete 12/21/2011    Past Surgical History  Procedure Laterality Date  . Joint replacement      both R & L replacement;  then R AKA  . Prostatectomy  2007  . Leg amputation above knee  2009    R leg  . Hernia repair      inguinal hernia  . Insert / replace / remove pacemaker  12/2011    St. Jude  . Cholecystectomy  04/04/13    open  . Permanent pacemaker insertion N/A 12/21/2011    Procedure: PERMANENT PACEMAKER INSERTION;  Surgeon: Thompson Grayer, MD;  Location: [facility] CATH LAB;  Service: Cardiovascular;  Laterality: N/A;    Current Outpatient Prescriptions  Medication Sig Dispense Refill  . atenolol (TENORMIN) 25 MG tablet Take 25 mg by mouth daily.    Marland Kitchen atorvastatin (LIPITOR) 10 MG tablet Take 10 mg by mouth daily.     . enzalutamide (XTANDI) 40 MG capsule Take 160 mg by mouth daily.    Marland Kitchen imipramine (TOFRANIL) 25 MG tablet Take 50 mg by mouth every morning.    . Multiple Vitamin (MULTIVITAMIN WITH MINERALS) TABS Take 1 tablet by mouth 2 (two) times daily.     No current facility-administered medications for this visit.    Allergies  Allergen Reactions  . Ace Inhibitors  Cough  . Statins Other (See Comments)    CK elevation    Review of Systems negative except from HPI and PMH  Physical Exam BP 130/60 mmHg  Pulse 66  Ht 6\' 2"  (1.88 m)  Wt 211 lb (95.709 kg)  BMI 27.08 kg/m2 Well developed and well nourished in no acute distress HENT normal E scleral and icterus clear Neck Supple JVP flat; carotids brisk and full Clear to ausculation Device pocket well healed; without hematoma or erythema.  There is no tethering Regular rate and rhythm,2/6 murmuer with split s2. gallops or rub Soft with active bowel sounds No clubbing cyanosis  No  Edema status post right lower leg amputation Alert and oriented, grossly normal motor and sensory function Skin Warm mildly diaphoretic   ECG demonstrates sinus rhythm with frequent PACs Intervals 22/14/44 Axis left -60 Right bundle branch block  Assessment and  Plan  Syncope  Pacemaker St. Jude  Atrial ectopy  Cough  Prostate cancer with pulmonary metastases

## 2015-01-21 LAB — CUP PACEART INCLINIC DEVICE CHECK
Date Time Interrogation Session: 20161207093751
Implantable Lead Implant Date: 20131106
Implantable Lead Location: 753859
Implantable Lead Location: 753860
Lead Channel Setting Pacing Amplitude: 0.875
Lead Channel Setting Pacing Amplitude: 2.125
Lead Channel Setting Pacing Pulse Width: 0.4 ms
MDC IDC LEAD IMPLANT DT: 20131106
MDC IDC LEAD MODEL: 1948
MDC IDC PG SERIAL: 7412730
MDC IDC SET LEADCHNL RV SENSING SENSITIVITY: 1.5 mV
Pulse Gen Model: 2210

## 2015-02-01 NOTE — Progress Notes (Signed)
[place]  Telephone:(336) [phone] Fax:(336) [phone]  ID: [name] OB: [birth year]  MR#: [id number]  [id number]  Patient Care Team: Albina Billet, MD as PCP - General (Unknown Physician Specialty) Christene Lye, MD (General Surgery)  CHIEF COMPLAINT:  Chief Complaint  Patient presents with  . Prostate Cancer    INTERVAL HISTORY: Patient returns to clinic today for further evaluation and discussion of his imaging results. He continues to tolerate Xtandi with only some mild fatigue. He does not complain of hot flashes today. He has no neurologic complaints. He is having mild dysuria which is being managed by his primary care physician. He denies any chest pain or shortness of breath.  He has a good appetite and denies weight loss.  He denies any nausea, vomiting, constipation, or diarrhea. Patient offers no further specific complaints today.  REVIEW OF SYSTEMS:   Review of Systems  Constitutional: Positive for malaise/fatigue. Negative for fever.  Respiratory: Negative.   Cardiovascular: Negative.   Gastrointestinal: Negative.   Genitourinary: Positive for dysuria.  Musculoskeletal: Negative.   Neurological: Positive for weakness.    As per HPI. Otherwise, a complete review of systems is negatve.  PAST MEDICAL HISTORY: Past Medical History  Diagnosis Date  . Hypertension   . Cancer [facility])     prostate 2007  . Headache(784.0)     migraines w/o HA; auras  . Multiple lung nodules 01/2012  . Pacemaker-St.Jude 12/22/2011  . Atrioventricular block, complete 12/21/2011    PAST SURGICAL HISTORY: Past Surgical History  Procedure Laterality Date  . Joint replacement      both R & L replacement;  then R AKA  . Prostatectomy  2007  . Leg amputation above knee  2009    R leg  . Hernia repair      inguinal hernia  . Insert / replace / remove pacemaker  12/2011    St. Jude  . Cholecystectomy  04/04/13    open  . Permanent pacemaker insertion  N/A 12/21/2011    Procedure: PERMANENT PACEMAKER INSERTION;  Surgeon: Thompson Grayer, MD;  Location: [facility] CATH LAB;  Service: Cardiovascular;  Laterality: N/A;    FAMILY HISTORY: Breast cancer.     ADVANCED DIRECTIVES:    HEALTH MAINTENANCE: Social History  Substance Use Topics  . Smoking status: Former Smoker    Types: Cigarettes    Quit date: 02/14/1978  . Smokeless tobacco: Former Systems developer    Quit date: 02/14/1978  . Alcohol Use: No     Colonoscopy:  PAP:  Bone density:  Lipid panel:  Allergies  Allergen Reactions  . Ace Inhibitors Cough  . Statins Other (See Comments)    CK elevation    Current Outpatient Prescriptions  Medication Sig Dispense Refill  . atenolol (TENORMIN) 25 MG tablet Take 25 mg by mouth daily.    Marland Kitchen atorvastatin (LIPITOR) 10 MG tablet Take 10 mg by mouth daily.     . enzalutamide (XTANDI) 40 MG capsule Take 160 mg by mouth daily.    Marland Kitchen imipramine (TOFRANIL) 25 MG tablet Take 50 mg by mouth every morning.    . Multiple Vitamin (MULTIVITAMIN WITH MINERALS) TABS Take 1 tablet by mouth 2 (two) times daily.     No current facility-administered medications for this visit.    OBJECTIVE: Filed Vitals:   01/19/15 1027  BP: 118/77  Pulse: 61  Temp: 96.6 F (35.9 C)  Resp: 16     There is no weight on file to calculate  BMI.    ECOG FS: 1  General: Well-developed, well-nourished, no acute distress. Sitting in a wheelchair. Eyes: anicteric sclera. Lungs: Clear to auscultation bilaterally. Heart: Regular rate and rhythm. No rubs, murmurs, or gallops. Abdomen: Soft, nontender, nondistended. No organomegaly noted, normoactive bowel sounds. Musculoskeletal: No edema, cyanosis, or clubbing. Prosthetic leg noted. Neuro: Alert, answering all questions appropriately. Cranial nerves grossly intact. Skin: No rashes or petechiae noted. Psych: Normal affect.    LAB RESULTS:  Lab Results  Component Value Date   NA 135 01/16/2015   K 3.9 01/16/2015   CL 101  01/16/2015   CO2 25 01/16/2015   GLUCOSE 141* 01/16/2015   BUN 19 01/16/2015   CREATININE 0.90 01/16/2015   CALCIUM 9.0 01/16/2015   PROT 6.4* 01/16/2015   ALBUMIN 3.8 01/16/2015   AST 22 01/16/2015   ALT 18 01/16/2015   ALKPHOS 55 01/16/2015   BILITOT 0.7 01/16/2015   GFRNONAA >60 01/16/2015   GFRAA >60 01/16/2015    Lab Results  Component Value Date   WBC 6.0 01/16/2015   NEUTROABS 4.0 01/16/2015   HGB 14.6 01/16/2015   HCT 43.0 01/16/2015   MCV 95.9 01/16/2015   PLT 184 01/16/2015     STUDIES: Ct Chest W Contrast  01/16/2015  CLINICAL DATA:  Prostate cancer with metastasis to lung. Restaging. Remote history of smoking. EXAM: CT CHEST WITH CONTRAST TECHNIQUE: Multidetector CT imaging of the chest was performed during intravenous contrast administration. CONTRAST:  50mL OMNIPAQUE IOHEXOL 300 MG/ML  SOLN COMPARISON:  05/16/2014 FINDINGS: Mediastinum/Nodes: No supraclavicular adenopathy. Dual lead pacer. Mild cardiomegaly with multivessel coronary artery atherosclerosis. Extensive aortic atherosclerosis with ulcerative plaque in the descending segment. No central pulmonary embolism, on this non-dedicated study. A left paratracheal node measures 11 mm on image 19/series 2 versus 12 mm on the prior (when remeasured). No hilar adenopathy. Right-sided Bochdalek's hernia containing fat, similar. Lungs/Pleura: No pleural fluid. Azygos fissure. Mild centrilobular emphysema. Minimal nodularity along the right minor fissure is unchanged, including on image 25/series 3. Left apical pulmonary nodule is improved. Only linear opacity remains at 1.7 x 0.8 cm on image 10/series 3. Compare 2.0 x 2.0 cm (and more nodular) on the prior exam. Clustered left lower lobe nodules have resolved. Superior segment left lower lobe pulmonary nodules have essentially resolved. There is minimal linear scarring remaining medially on image 25/series 3. Anterior right upper lobe pulmonary nodules also resolved with only  mild scarring remaining on image 22/series 3. Upper abdomen: Normal imaged portions of the liver, spleen, stomach, pancreas, right adrenal gland. Left adrenal thickening is similar. Norma imaged left kidney. Advanced abdominal aortic atherosclerosis. Musculoskeletal: Mild osteopenia.  No focal osseous lesion. IMPRESSION: 1. Marked response to therapy of pulmonary metastasis. Primarily areas of linear scarring remain. No new sites of disease identified within the chest. 2. Improvement in mediastinal adenopathy. 3.  Atherosclerosis, including within the coronary arteries. Electronically Signed   By: Abigail Miyamoto M.D.   On: 01/16/2015 11:24    ASSESSMENT: Stage IV prostate cancer with biopsy confirmed metastasis to lung.  PLAN:    1.  Prostate cancer:  CT scan results reviewed independently and reported as above with significant improvement of anesthetic disease. Lung nodules previously confirmed positive for prostate cancer by biopsy.  PSA is is now less than 0.01. Continue 160 mg oral Xtandi daily. Return to clinic in 3 months with repeat imaging, laboratory work, and further evaluation. 2. Weakness/fatigue: Likely multifactorial, monitor. 3. Dysuria: Treatment per primary care.   Patient  expressed understanding and was in agreement with this plan. He also understands that He can call clinic at any time with any questions, concerns, or complaints.   No matching staging information was found for the patient.  Lloyd Huger, MD   02/01/2015 8:15 AM

## 2015-02-03 ENCOUNTER — Encounter: Payer: Self-pay | Admitting: Internal Medicine

## 2015-04-21 ENCOUNTER — Inpatient Hospital Stay: Payer: Medicare Other

## 2015-04-21 ENCOUNTER — Encounter: Payer: Medicare Other | Admitting: *Deleted

## 2015-04-21 ENCOUNTER — Ambulatory Visit
Admission: RE | Admit: 2015-04-21 | Discharge: 2015-04-21 | Disposition: A | Discharge status: Home / Self Care | Payer: Medicare Other | Source: Ambulatory Visit | Attending: Oncology | Admitting: Oncology

## 2015-04-21 DIAGNOSIS — I2510 Atherosclerotic heart disease of native coronary artery without angina pectoris: Secondary | ICD-10-CM | POA: Diagnosis not present

## 2015-04-21 DIAGNOSIS — R531 Weakness: Secondary | ICD-10-CM | POA: Insufficient documentation

## 2015-04-21 DIAGNOSIS — K439 Ventral hernia without obstruction or gangrene: Secondary | ICD-10-CM | POA: Insufficient documentation

## 2015-04-21 DIAGNOSIS — I10 Essential (primary) hypertension: Secondary | ICD-10-CM | POA: Insufficient documentation

## 2015-04-21 DIAGNOSIS — C7800 Secondary malignant neoplasm of unspecified lung: Secondary | ICD-10-CM

## 2015-04-21 DIAGNOSIS — Z87891 Personal history of nicotine dependence: Secondary | ICD-10-CM | POA: Diagnosis not present

## 2015-04-21 DIAGNOSIS — C61 Malignant neoplasm of prostate: Secondary | ICD-10-CM | POA: Insufficient documentation

## 2015-04-21 DIAGNOSIS — I7090 Unspecified atherosclerosis: Secondary | ICD-10-CM | POA: Insufficient documentation

## 2015-04-21 DIAGNOSIS — Z950 Presence of cardiac pacemaker: Secondary | ICD-10-CM | POA: Insufficient documentation

## 2015-04-21 DIAGNOSIS — Z9221 Personal history of antineoplastic chemotherapy: Secondary | ICD-10-CM | POA: Diagnosis not present

## 2015-04-21 DIAGNOSIS — R5381 Other malaise: Secondary | ICD-10-CM | POA: Diagnosis not present

## 2015-04-21 DIAGNOSIS — Z79899 Other long term (current) drug therapy: Secondary | ICD-10-CM | POA: Insufficient documentation

## 2015-04-21 DIAGNOSIS — Z79818 Long term (current) use of other agents affecting estrogen receptors and estrogen levels: Secondary | ICD-10-CM | POA: Insufficient documentation

## 2015-04-21 DIAGNOSIS — R5383 Other fatigue: Secondary | ICD-10-CM | POA: Insufficient documentation

## 2015-04-21 LAB — CBC WITH DIFFERENTIAL/PLATELET
BASOS PCT: 1 %
Basophils Absolute: 0.1 10*3/uL (ref 0–0.1)
EOS ABS: 0.2 10*3/uL (ref 0–0.7)
Eosinophils Relative: 3 %
HCT: 42.9 % (ref 40.0–52.0)
HEMOGLOBIN: 14.7 g/dL (ref 13.0–18.0)
Lymphocytes Relative: 20 %
Lymphs Abs: 1.2 10*3/uL (ref 1.0–3.6)
MCH: 33.1 pg (ref 26.0–34.0)
MCHC: 34.3 g/dL (ref 32.0–36.0)
MCV: 96.4 fL (ref 80.0–100.0)
MONOS PCT: 6 %
Monocytes Absolute: 0.4 10*3/uL (ref 0.2–1.0)
Neutro Abs: 4.1 10*3/uL (ref 1.4–6.5)
Neutrophils Relative %: 70 %
Platelets: 168 10*3/uL (ref 150–440)
RBC: 4.45 MIL/uL (ref 4.40–5.90)
RDW: 12.8 % (ref 11.5–14.5)
WBC: 6 10*3/uL (ref 3.8–10.6)

## 2015-04-21 LAB — COMPREHENSIVE METABOLIC PANEL
ALBUMIN: 4.2 g/dL (ref 3.5–5.0)
ALK PHOS: 63 U/L (ref 38–126)
ALT: 14 U/L — ABNORMAL LOW (ref 17–63)
ANION GAP: 4 — AB (ref 5–15)
AST: 18 U/L (ref 15–41)
BUN: 16 mg/dL (ref 6–20)
CO2: 26 mmol/L (ref 22–32)
Calcium: 9.2 mg/dL (ref 8.9–10.3)
Chloride: 107 mmol/L (ref 101–111)
Creatinine, Ser: 0.84 mg/dL (ref 0.61–1.24)
GFR calc Af Amer: >60 mL/min (ref >60)
GFR calc non Af Amer: >60 mL/min (ref >60)
GLUCOSE: 134 mg/dL — AB (ref 65–99)
POTASSIUM: 5 mmol/L (ref 3.5–5.1)
SODIUM: 137 mmol/L (ref 135–145)
Total Bilirubin: 0.8 mg/dL (ref 0.3–1.2)
Total Protein: 6.5 g/dL (ref 6.5–8.1)

## 2015-04-21 LAB — PSA: PSA: <0.01 ng/mL (ref 0.00–4.00)

## 2015-04-21 MED ORDER — IOHEXOL 300 MG/ML  SOLN
75.0000 mL | Freq: Once | INTRAMUSCULAR | Status: AC | PRN
  Administered 2015-04-21: 75 mL via INTRAVENOUS

## 2015-04-23 ENCOUNTER — Inpatient Hospital Stay: Payer: Medicare Other | Attending: Oncology | Admitting: Oncology

## 2015-04-23 VITALS — BP 114/66 | HR 67 | Temp 96.7°F | Resp 18

## 2015-04-23 DIAGNOSIS — R5381 Other malaise: Secondary | ICD-10-CM

## 2015-04-23 DIAGNOSIS — Z79899 Other long term (current) drug therapy: Secondary | ICD-10-CM

## 2015-04-23 DIAGNOSIS — C7800 Secondary malignant neoplasm of unspecified lung: Secondary | ICD-10-CM

## 2015-04-23 DIAGNOSIS — R5383 Other fatigue: Secondary | ICD-10-CM | POA: Diagnosis not present

## 2015-04-23 DIAGNOSIS — C61 Malignant neoplasm of prostate: Secondary | ICD-10-CM | POA: Diagnosis not present

## 2015-04-23 DIAGNOSIS — Z950 Presence of cardiac pacemaker: Secondary | ICD-10-CM

## 2015-04-23 DIAGNOSIS — I10 Essential (primary) hypertension: Secondary | ICD-10-CM

## 2015-04-23 DIAGNOSIS — R531 Weakness: Secondary | ICD-10-CM

## 2015-04-23 DIAGNOSIS — I2510 Atherosclerotic heart disease of native coronary artery without angina pectoris: Secondary | ICD-10-CM

## 2015-04-23 DIAGNOSIS — Z87891 Personal history of nicotine dependence: Secondary | ICD-10-CM

## 2015-04-23 DIAGNOSIS — Z79818 Long term (current) use of other agents affecting estrogen receptors and estrogen levels: Secondary | ICD-10-CM | POA: Diagnosis not present

## 2015-04-23 DIAGNOSIS — K439 Ventral hernia without obstruction or gangrene: Secondary | ICD-10-CM

## 2015-04-23 NOTE — Progress Notes (Signed)
Patient does not offer any problems today.  

## 2015-04-24 ENCOUNTER — Encounter: Payer: Self-pay | Admitting: Cardiology

## 2015-04-30 ENCOUNTER — Telehealth: Payer: Self-pay | Admitting: Internal Medicine

## 2015-04-30 NOTE — Telephone Encounter (Signed)
Patient says the monitor is stuck on the "tower" icon. He is using cellular adapter with monitor. I had the patient reset the monitor. The monitor reset and started to transmit the signal again- I advised the patient to wait 15 minutes by the monitor so the transmission could send- if the monitor was still beeping at 4:20 then he should call [employer]. He is agreeable.

## 2015-04-30 NOTE — Telephone Encounter (Signed)
New message      Pt is trying to send a transmission.  His transmission box is beeping.  Please call today

## 2015-05-02 NOTE — Progress Notes (Signed)
[place]  [phone] Fax:(336) [phone]  ID: Corky Mull OB: 05-26-1930  MR#: [id number]  [id number]  Patient Care Team: Albina Billet, MD as PCP - General (Unknown Physician Specialty) Christene Lye, MD (General Surgery)  CHIEF COMPLAINT:  Chief Complaint  Patient presents with  . Prostate Cancer    INTERVAL HISTORY: Patient returns to clinic today for further evaluation and discussion of his imaging results. He continues to tolerate Xtandi with only some mild fatigue. He does not complain of hot flashes today. He has no neurologic complaints. He denies any recent fevers or illnesses. He denies any chest pain or shortness of breath.  He has a good appetite and denies weight loss.  He denies any nausea, vomiting, constipation, or diarrhea. Patient offers no further specific complaints today.  REVIEW OF SYSTEMS:   Review of Systems  Constitutional: Positive for malaise/fatigue. Negative for fever.  Respiratory: Negative.  Negative for shortness of breath.   Cardiovascular: Negative.  Negative for chest pain.  Gastrointestinal: Negative.   Genitourinary: Negative for dysuria.  Musculoskeletal: Negative.   Neurological: Negative.     As per HPI. Otherwise, a complete review of systems is negatve.  PAST MEDICAL HISTORY: Past Medical History  Diagnosis Date  . Hypertension   . Cancer [facility])     prostate 2007  . Headache(784.0)     migraines w/o HA; auras  . Multiple lung nodules 01/2012  . Pacemaker-St.Jude 12/22/2011  . Atrioventricular block, complete 12/21/2011    PAST SURGICAL HISTORY: Past Surgical History  Procedure Laterality Date  . Joint replacement      both R & L replacement;  then R AKA  . Prostatectomy  2007  . Leg amputation above knee  2009    R leg  . Hernia repair      inguinal hernia  . Insert / replace / remove pacemaker  12/2011    St. Jude  . Cholecystectomy  04/04/13    open  . Permanent pacemaker  insertion N/A 12/21/2011    Procedure: PERMANENT PACEMAKER INSERTION;  Surgeon: Thompson Grayer, MD;  Location: [facility] CATH LAB;  Service: Cardiovascular;  Laterality: N/A;    FAMILY HISTORY: Breast cancer.     ADVANCED DIRECTIVES:    HEALTH MAINTENANCE: Social History  Substance Use Topics  . Smoking status: Former Smoker    Types: Cigarettes    Quit date: 02/14/1978  . Smokeless tobacco: Former Systems developer    Quit date: 02/14/1978  . Alcohol Use: No     Colonoscopy:  PAP:  Bone density:  Lipid panel:  Allergies  Allergen Reactions  . Ace Inhibitors Cough  . Statins Other (See Comments)    CK elevation    Current Outpatient Prescriptions  Medication Sig Dispense Refill  . atenolol (TENORMIN) 25 MG tablet Take 25 mg by mouth daily.    Marland Kitchen atorvastatin (LIPITOR) 10 MG tablet Take 10 mg by mouth daily.     . enzalutamide (XTANDI) 40 MG capsule Take 160 mg by mouth daily.    Marland Kitchen imipramine (TOFRANIL) 25 MG tablet Take 50 mg by mouth every morning.    . Multiple Vitamin (MULTIVITAMIN WITH MINERALS) TABS Take 1 tablet by mouth 2 (two) times daily.     No current facility-administered medications for this visit.    OBJECTIVE: Filed Vitals:   04/23/15 1115  BP: 114/66  Pulse: 67  Temp: 96.7 F (35.9 C)  Resp: 18     There is no weight on file  to calculate BMI.    ECOG FS: 1  General: Well-developed, well-nourished, no acute distress. Sitting in a wheelchair. Eyes: anicteric sclera. Lungs: Clear to auscultation bilaterally. Heart: Regular rate and rhythm. No rubs, murmurs, or gallops. Abdomen: Soft, nontender, nondistended. No organomegaly noted, normoactive bowel sounds. Musculoskeletal: No edema, cyanosis, or clubbing. Prosthetic leg noted. Neuro: Alert, answering all questions appropriately. Cranial nerves grossly intact. Skin: No rashes or petechiae noted. Psych: Normal affect.    LAB RESULTS:  Lab Results  Component Value Date   NA 137 04/21/2015   K 5.0 04/21/2015    CL 107 04/21/2015   CO2 26 04/21/2015   GLUCOSE 134* 04/21/2015   BUN 16 04/21/2015   CREATININE 0.84 04/21/2015   CALCIUM 9.2 04/21/2015   PROT 6.5 04/21/2015   ALBUMIN 4.2 04/21/2015   AST 18 04/21/2015   ALT 14* 04/21/2015   ALKPHOS 63 04/21/2015   BILITOT 0.8 04/21/2015   GFRNONAA >60 04/21/2015   GFRAA >60 04/21/2015    Lab Results  Component Value Date   WBC 6.0 04/21/2015   NEUTROABS 4.1 04/21/2015   HGB 14.7 04/21/2015   HCT 42.9 04/21/2015   MCV 96.4 04/21/2015   PLT 168 04/21/2015   Lab Results  Component Value Date   PSA <0.01 04/21/2015     STUDIES: Ct Chest W Contrast  04/21/2015  CLINICAL DATA:  82 year old male with history of prostate cancer diagnosed 7 years ago with metastatic disease to the lungs noted 2 years ago. Currently on chemotherapy for lung metastasis. EXAM: CT CHEST WITH CONTRAST TECHNIQUE: Multidetector CT imaging of the chest was performed during intravenous contrast administration. CONTRAST:  60mL OMNIPAQUE IOHEXOL 300 MG/ML  SOLN COMPARISON:  Multiple priors, most recently chest CT 01/16/2015. FINDINGS: Mediastinum/Lymph Nodes: Heart size is normal. There is no significant pericardial fluid, thickening or pericardial calcification. There is atherosclerosis of the thoracic aorta, the great vessels of the mediastinum and the coronary arteries, including calcified atherosclerotic plaque in the left anterior descending, left circumflex and right coronary arteries. Calcifications of the aortic valve, mitral valve and mitral annulus. No pathologically enlarged mediastinal or hilar lymph nodes. Esophagus is unremarkable in appearance. No axillary lymphadenopathy. Left-sided pacemaker in position with lead tips terminating in the right atrial appendage and right ventricular apex. Lungs/Pleura: Small right-sided Bochdalek's hernia. As with the prior examination, the multiple treated pulmonary metastases all appear stable to decreased in size compared to prior  examinations. The best example of this is the lesion in the left upper lobe apex which continues to decrease in size, currently measuring 13 x 4 mm (previously 17 x 8 mm). No new suspicious appearing pulmonary nodules or masses are otherwise noted. Small focus of peribronchovascular ground-glass attenuation in the anterior aspect of the right upper lobe (image 20 of series 3), new compared to the prior examination, favored to be infectious or inflammatory in etiology. Azygos lobe (normal anatomical variant) incidentally noted. No pleural effusions. Upper Abdomen: Small ventral hernia containing only omental fat in the epigastric region slightly to the right of midline. Status post cholecystectomy. Extensive atherosclerosis. Musculoskeletal/Soft Tissues: There are no aggressive appearing lytic or blastic lesions noted in the visualized portions of the skeleton. IMPRESSION: 1. Continued positive response to therapy, as demonstrated by stable to decreased size of numerous previously noted pulmonary metastasis. No new metastatic lesions are identified on today's examination. 2. Small focus of peribronchovascular ground-glass attenuation in the anterior aspect of the right upper lobe, new compared to the prior study, presumably infectious  or inflammatory in etiology. 3.  Atherosclerosis, including three-vessel coronary artery disease. 4. Additional incidental findings, similar to prior examinations, as above. Electronically Signed   By: Vinnie Langton M.D.   On: 04/21/2015 10:57    ASSESSMENT: Stage IV prostate cancer with biopsy confirmed metastasis to lung.  PLAN:    1.  Prostate cancer:  CT scan results reviewed independently and reported as above. Patient's PSA is also undetectable at less than 0.1.  Lung nodules previously confirmed positive for prostate cancer by biopsy. Continue 160 mg oral Xtandi daily. Patient does not require any further imaging unless there is suspicion of recurrence or PSA begins to  increase. Return to clinic in 3 months with repeat laboratory work and further evaluation.  2. Weakness/fatigue: Likely multifactorial, monitor.  Patient expressed understanding and was in agreement with this plan. He also understands that He can call clinic at any time with any questions, concerns, or complaints.    Lloyd Huger, MD   05/02/2015 11:05 AM

## 2015-05-05 ENCOUNTER — Telehealth: Payer: Self-pay | Admitting: Internal Medicine

## 2015-05-05 NOTE — Telephone Encounter (Signed)
New message  Pt received noshow letter from remote- stated he is having trouble sending transmission. Please call back and discuss.

## 2015-05-05 NOTE — Telephone Encounter (Signed)
Spoke w/ pt and informed him that his monitor has lost connection w/ his implanted device. Instructed pt to call tech services to get help w/ trouble shooting. Informed pt to call first thing in the morning around 8 AM for a shorter wait. Pt verbalized understanding.

## 2015-05-06 ENCOUNTER — Ambulatory Visit (INDEPENDENT_AMBULATORY_CARE_PROVIDER_SITE_OTHER): Payer: Medicare Other | Admitting: *Deleted

## 2015-05-06 DIAGNOSIS — I442 Atrioventricular block, complete: Secondary | ICD-10-CM

## 2015-05-07 NOTE — Progress Notes (Signed)
Remote pacemaker transmission.   

## 2015-06-05 ENCOUNTER — Encounter: Payer: Self-pay | Admitting: Cardiology

## 2015-06-05 LAB — CUP PACEART REMOTE DEVICE CHECK
Battery Remaining Longevity: 119 mo
Battery Remaining Percentage: 91 %
Battery Voltage: 2.95 V
Brady Statistic RA Percent Paced: 6.4 %
Brady Statistic RV Percent Paced: 1 %
Date Time Interrogation Session: 20170322125744
Implantable Lead Location: 753859
Implantable Lead Model: 1948
Lead Channel Impedance Value: 350 Ohm
Lead Channel Pacing Threshold Pulse Width: 0.5 ms
Lead Channel Sensing Intrinsic Amplitude: 1.7 mV
Lead Channel Setting Sensing Sensitivity: 1.5 mV
MDC IDC LEAD IMPLANT DT: 20131106
MDC IDC LEAD IMPLANT DT: 20131106
MDC IDC LEAD LOCATION: 753860
MDC IDC MSMT LEADCHNL RA PACING THRESHOLD AMPLITUDE: 1 V
MDC IDC MSMT LEADCHNL RV IMPEDANCE VALUE: 680 Ohm
MDC IDC MSMT LEADCHNL RV PACING THRESHOLD AMPLITUDE: 0.5 V
MDC IDC MSMT LEADCHNL RV PACING THRESHOLD PULSEWIDTH: 0.4 ms
MDC IDC MSMT LEADCHNL RV SENSING INTR AMPL: 8.6 mV
MDC IDC PG SERIAL: 7412730
MDC IDC SET LEADCHNL RA PACING AMPLITUDE: 2 V
MDC IDC SET LEADCHNL RV PACING AMPLITUDE: 0.75 V
MDC IDC SET LEADCHNL RV PACING PULSEWIDTH: 0.4 ms
MDC IDC STAT BRADY AP VP PERCENT: 1 %
MDC IDC STAT BRADY AP VS PERCENT: 7.8 %
MDC IDC STAT BRADY AS VP PERCENT: 1 %
MDC IDC STAT BRADY AS VS PERCENT: 91 %
Pulse Gen Model: 2210

## 2015-06-19 ENCOUNTER — Telehealth: Payer: Self-pay | Admitting: *Deleted

## 2015-06-19 NOTE — Telephone Encounter (Signed)
When he called to reorder [name], he was told that copay assistance has run out and gave him several numbers to call, but no one has any help for him. Please help him get assistance

## 2015-06-19 NOTE — Telephone Encounter (Signed)
Spoke with patient and the records have previously been sent but insurance is still looking for answers to specific questions about patient's health (which is on the letter in [place] office).  I will work on this Monday when I return to [place] office.

## 2015-06-19 NOTE — Telephone Encounter (Signed)
Patient contacted adn said he will come by Monday or Tuesday to sign form

## 2015-06-19 NOTE — Telephone Encounter (Signed)
I have patient assistance form filled out for [name] in [place], it just needs his signature. If they can come by the clinic one day next week I can get the form signed and faxed to drug company. I initiated form after patients last follow up visit but it was not needed at that time.

## 2015-06-25 ENCOUNTER — Encounter: Payer: Self-pay | Admitting: *Deleted

## 2015-06-25 NOTE — Progress Notes (Signed)
Prior Authorization form faxed to [employer], requested expedited decision as patient is due for [place] next week.

## 2015-06-29 ENCOUNTER — Other Ambulatory Visit: Payer: Self-pay | Admitting: *Deleted

## 2015-06-29 MED ORDER — ENZALUTAMIDE 40 MG PO CAPS: 160.0000 mg | ORAL_CAPSULE | Freq: Every day | ORAL | Status: DC

## 2015-06-29 NOTE — Telephone Encounter (Signed)
Asking for Xtandi refill

## 2015-06-30 ENCOUNTER — Telehealth: Payer: Self-pay | Admitting: *Deleted

## 2015-06-30 NOTE — Telephone Encounter (Signed)
Patient notified that Xtandi support solutions approved him for copay assistance. Patient stated company had reached out to him and drug would be shipped immediately.

## 2015-07-29 ENCOUNTER — Inpatient Hospital Stay: Payer: Medicare Other | Attending: Oncology | Admitting: *Deleted

## 2015-07-29 ENCOUNTER — Inpatient Hospital Stay (HOSPITAL_BASED_OUTPATIENT_CLINIC_OR_DEPARTMENT_OTHER): Payer: Medicare Other | Admitting: Oncology

## 2015-07-29 VITALS — BP 130/69 | HR 72 | Temp 97.8°F | Resp 18 | Wt 202.6 lb

## 2015-07-29 DIAGNOSIS — C7800 Secondary malignant neoplasm of unspecified lung: Secondary | ICD-10-CM | POA: Diagnosis not present

## 2015-07-29 DIAGNOSIS — Z79899 Other long term (current) drug therapy: Secondary | ICD-10-CM

## 2015-07-29 DIAGNOSIS — C61 Malignant neoplasm of prostate: Secondary | ICD-10-CM

## 2015-07-29 DIAGNOSIS — R5383 Other fatigue: Secondary | ICD-10-CM | POA: Insufficient documentation

## 2015-07-29 DIAGNOSIS — I4430 Unspecified atrioventricular block: Secondary | ICD-10-CM | POA: Diagnosis not present

## 2015-07-29 DIAGNOSIS — Z87891 Personal history of nicotine dependence: Secondary | ICD-10-CM | POA: Diagnosis not present

## 2015-07-29 DIAGNOSIS — I10 Essential (primary) hypertension: Secondary | ICD-10-CM

## 2015-07-29 DIAGNOSIS — R531 Weakness: Secondary | ICD-10-CM | POA: Insufficient documentation

## 2015-07-29 DIAGNOSIS — Z8669 Personal history of other diseases of the nervous system and sense organs: Secondary | ICD-10-CM | POA: Diagnosis not present

## 2015-07-29 DIAGNOSIS — K5900 Constipation, unspecified: Secondary | ICD-10-CM | POA: Insufficient documentation

## 2015-07-29 LAB — CBC WITH DIFFERENTIAL/PLATELET
Basophils Absolute: 0.1 10*3/uL (ref 0–0.1)
Basophils Relative: 1 %
EOS ABS: 0.3 10*3/uL (ref 0–0.7)
EOS PCT: 5 %
HCT: 44.1 % (ref 40.0–52.0)
Hemoglobin: 15.2 g/dL (ref 13.0–18.0)
LYMPHS ABS: 1.2 10*3/uL (ref 1.0–3.6)
Lymphocytes Relative: 20 %
MCH: 33.3 pg (ref 26.0–34.0)
MCHC: 34.5 g/dL (ref 32.0–36.0)
MCV: 96.6 fL (ref 80.0–100.0)
MONOS PCT: 10 %
Monocytes Absolute: 0.6 10*3/uL (ref 0.2–1.0)
Neutro Abs: 4 10*3/uL (ref 1.4–6.5)
Neutrophils Relative %: 64 %
PLATELETS: 191 10*3/uL (ref 150–440)
RBC: 4.56 MIL/uL (ref 4.40–5.90)
RDW: 12.7 % (ref 11.5–14.5)
WBC: 6.2 10*3/uL (ref 3.8–10.6)

## 2015-07-29 LAB — COMPREHENSIVE METABOLIC PANEL
ALK PHOS: 54 U/L (ref 38–126)
ALT: 16 U/L — ABNORMAL LOW (ref 17–63)
ANION GAP: 8 (ref 5–15)
AST: 23 U/L (ref 15–41)
Albumin: 4.2 g/dL (ref 3.5–5.0)
BUN: 23 mg/dL — ABNORMAL HIGH (ref 6–20)
CALCIUM: 9.6 mg/dL (ref 8.9–10.3)
CO2: 28 mmol/L (ref 22–32)
Chloride: 103 mmol/L (ref 101–111)
Creatinine, Ser: 0.87 mg/dL (ref 0.61–1.24)
GFR calc non Af Amer: >60 mL/min (ref >60)
Glucose, Bld: 130 mg/dL — ABNORMAL HIGH (ref 65–99)
Potassium: 4.8 mmol/L (ref 3.5–5.1)
SODIUM: 139 mmol/L (ref 135–145)
Total Bilirubin: 0.9 mg/dL (ref 0.3–1.2)
Total Protein: 7 g/dL (ref 6.5–8.1)

## 2015-07-29 LAB — PSA

## 2015-07-29 NOTE — Progress Notes (Signed)
Patient has noted some dysuria and some constipation issues.

## 2015-08-04 NOTE — Progress Notes (Signed)
[place]  Telephone:(336) [phone] Fax:(336) [phone]  ID: Corky Mull OB: 04-15-1930  MR#: [id number]  [id number]  Patient Care Team: Albina Billet, MD as PCP - General (Unknown Physician Specialty) Christene Lye, MD (General Surgery)  CHIEF COMPLAINT:  Chief Complaint  Patient presents with  . Prostate Cancer    INTERVAL HISTORY: Patient returns to clinic today for further evaluation and laboratory work. He continues to tolerate Xtandi with only some mild fatigue. He has occasional constipation. He does not complain of hot flashes today. He has no neurologic complaints. He denies any recent fevers or illnesses. He denies any chest pain or shortness of breath.  He has a good appetite and denies weight loss.  He denies any nausea, vomiting, or diarrhea. Patient offers no further specific complaints today.  REVIEW OF SYSTEMS:   Review of Systems  Constitutional: Positive for malaise/fatigue. Negative for fever.  Respiratory: Negative.  Negative for shortness of breath.   Cardiovascular: Negative.  Negative for chest pain.  Gastrointestinal: Positive for constipation.  Genitourinary: Negative for dysuria.  Musculoskeletal: Negative.   Neurological: Negative.     As per HPI. Otherwise, a complete review of systems is negatve.  PAST MEDICAL HISTORY: Past Medical History  Diagnosis Date  . Hypertension   . Cancer [facility])     prostate 2007  . Headache(784.0)     migraines w/o HA; auras  . Multiple lung nodules 01/2012  . Pacemaker-St.Jude 12/22/2011  . Atrioventricular block, complete 12/21/2011    PAST SURGICAL HISTORY: Past Surgical History  Procedure Laterality Date  . Joint replacement      both R & L replacement;  then R AKA  . Prostatectomy  2007  . Leg amputation above knee  2009    R leg  . Hernia repair      inguinal hernia  . Insert / replace / remove pacemaker  12/2011    St. Jude  . Cholecystectomy  04/04/13    open  .  Permanent pacemaker insertion N/A 12/21/2011    Procedure: PERMANENT PACEMAKER INSERTION;  Surgeon: Thompson Grayer, MD;  Location: [facility] CATH LAB;  Service: Cardiovascular;  Laterality: N/A;    FAMILY HISTORY: Breast cancer.     ADVANCED DIRECTIVES:    HEALTH MAINTENANCE: Social History  Substance Use Topics  . Smoking status: Former Smoker    Types: Cigarettes    Quit date: 02/14/1978  . Smokeless tobacco: Former Systems developer    Quit date: 02/14/1978  . Alcohol Use: No     Colonoscopy:  PAP:  Bone density:  Lipid panel:  Allergies  Allergen Reactions  . Ace Inhibitors Cough  . Statins Other (See Comments)    CK elevation    Current Outpatient Prescriptions  Medication Sig Dispense Refill  . atenolol (TENORMIN) 25 MG tablet Take 25 mg by mouth daily.    Marland Kitchen atorvastatin (LIPITOR) 10 MG tablet Take 10 mg by mouth daily.     . enzalutamide (XTANDI) 40 MG capsule Take 4 capsules (160 mg total) by mouth daily. 120 capsule 3  . imipramine (TOFRANIL) 25 MG tablet Take 50 mg by mouth every morning.    . Multiple Vitamin (MULTIVITAMIN WITH MINERALS) TABS Take 1 tablet by mouth 2 (two) times daily.     No current facility-administered medications for this visit.    OBJECTIVE: Filed Vitals:   07/29/15 0958  BP: 130/69  Pulse: 72  Temp: 97.8 F (36.6 C)  Resp: 18     Body  mass index is 26 kg/(m^2).    ECOG FS: 1  General: Well-developed, well-nourished, no acute distress. Sitting in a wheelchair. Eyes: anicteric sclera. Lungs: Clear to auscultation bilaterally. Heart: Regular rate and rhythm. No rubs, murmurs, or gallops. Abdomen: Soft, nontender, nondistended. No organomegaly noted, normoactive bowel sounds. Musculoskeletal: No edema, cyanosis, or clubbing. Prosthetic leg noted. Neuro: Alert, answering all questions appropriately. Cranial nerves grossly intact. Skin: No rashes or petechiae noted. Psych: Normal affect.    LAB RESULTS:  Lab Results  Component Value Date   NA  139 07/29/2015   K 4.8 07/29/2015   CL 103 07/29/2015   CO2 28 07/29/2015   GLUCOSE 130* 07/29/2015   BUN 23* 07/29/2015   CREATININE 0.87 07/29/2015   CALCIUM 9.6 07/29/2015   PROT 7.0 07/29/2015   ALBUMIN 4.2 07/29/2015   AST 23 07/29/2015   ALT 16* 07/29/2015   ALKPHOS 54 07/29/2015   BILITOT 0.9 07/29/2015   GFRNONAA >60 07/29/2015   GFRAA >60 07/29/2015    Lab Results  Component Value Date   WBC 6.2 07/29/2015   NEUTROABS 4.0 07/29/2015   HGB 15.2 07/29/2015   HCT 44.1 07/29/2015   MCV 96.6 07/29/2015   PLT 191 07/29/2015   Lab Results  Component Value Date   PSA <0.01 07/29/2015     STUDIES: No results found.  ASSESSMENT: Stage IV prostate cancer with biopsy confirmed metastasis to lung.  PLAN:    1.  Prostate cancer:  CT scan results from April 21, 2015 reviewed independently and reported continue positive response to therapy as demonstrated by the decrease in size of numerous pulmonary metastasis. No new metastatic lesions were noted. Patient's PSA is also undetectable at less than 0.1.  Lung nodules previously confirmed positive for prostate cancer by biopsy. Continue 160 mg oral Xtandi daily. Patient does not require any further imaging unless there is suspicion of recurrence or PSA begins to increase. Return to clinic in 4 months with repeat laboratory work and further evaluation.  2. Weakness/fatigue: Likely multifactorial, monitor. 3. Constipation: Continue OTC treatments as needed.  Patient expressed understanding and was in agreement with this plan. He also understands that He can call clinic at any time with any questions, concerns, or complaints.    Lloyd Huger, MD   08/04/2015 2:14 PM

## 2015-08-05 ENCOUNTER — Ambulatory Visit (INDEPENDENT_AMBULATORY_CARE_PROVIDER_SITE_OTHER): Payer: Medicare Other | Admitting: *Deleted

## 2015-08-05 ENCOUNTER — Telehealth: Payer: Self-pay | Admitting: Cardiology

## 2015-08-05 DIAGNOSIS — I442 Atrioventricular block, complete: Secondary | ICD-10-CM

## 2015-08-05 NOTE — Telephone Encounter (Signed)
LMOVM reminding pt to send remote transmission.   

## 2015-08-06 NOTE — Progress Notes (Signed)
Remote pacemaker transmission.   

## 2015-08-07 ENCOUNTER — Encounter: Payer: Self-pay | Admitting: Cardiology

## 2015-08-11 LAB — CUP PACEART REMOTE DEVICE CHECK
Battery Remaining Longevity: 119 mo
Battery Remaining Percentage: 91 %
Battery Voltage: 2.95 V
Brady Statistic AP VP Percent: 1 %
Brady Statistic AP VS Percent: 7.6 %
Brady Statistic AS VP Percent: 1 %
Brady Statistic AS VS Percent: 91 %
Brady Statistic RA Percent Paced: 6.3 %
Brady Statistic RV Percent Paced: 1 %
Date Time Interrogation Session: 20170621202630
Implantable Lead Implant Date: 20131106
Implantable Lead Implant Date: 20131106
Implantable Lead Location: 753859
Implantable Lead Location: 753860
Implantable Lead Model: 1948
Lead Channel Impedance Value: 350 Ohm
Lead Channel Impedance Value: 730 Ohm
Lead Channel Pacing Threshold Amplitude: 0.625 V
Lead Channel Pacing Threshold Amplitude: 1 V
Lead Channel Pacing Threshold Pulse Width: 0.4 ms
Lead Channel Pacing Threshold Pulse Width: 0.5 ms
Lead Channel Sensing Intrinsic Amplitude: 2.1 mV
Lead Channel Sensing Intrinsic Amplitude: 9.1 mV
Lead Channel Setting Pacing Amplitude: 0.875
Lead Channel Setting Pacing Amplitude: 2 V
Lead Channel Setting Pacing Pulse Width: 0.4 ms
Lead Channel Setting Sensing Sensitivity: 1.5 mV
Pulse Gen Model: 2210
Pulse Gen Serial Number: 7412730

## 2015-09-18 ENCOUNTER — Other Ambulatory Visit: Payer: Self-pay | Admitting: Oncology

## 2015-10-30 ENCOUNTER — Encounter (INDEPENDENT_AMBULATORY_CARE_PROVIDER_SITE_OTHER): Payer: Self-pay

## 2015-10-30 DIAGNOSIS — I2510 Atherosclerotic heart disease of native coronary artery without angina pectoris: Secondary | ICD-10-CM | POA: Insufficient documentation

## 2015-10-30 DIAGNOSIS — I739 Peripheral vascular disease, unspecified: Secondary | ICD-10-CM | POA: Insufficient documentation

## 2015-10-30 DIAGNOSIS — I25119 Atherosclerotic heart disease of native coronary artery with unspecified angina pectoris: Secondary | ICD-10-CM

## 2015-11-05 ENCOUNTER — Telehealth: Payer: Self-pay | Admitting: Cardiology

## 2015-11-05 ENCOUNTER — Ambulatory Visit (INDEPENDENT_AMBULATORY_CARE_PROVIDER_SITE_OTHER): Payer: Medicare Other | Admitting: *Deleted

## 2015-11-05 DIAGNOSIS — I442 Atrioventricular block, complete: Secondary | ICD-10-CM | POA: Diagnosis not present

## 2015-11-05 LAB — CUP PACEART REMOTE DEVICE CHECK
Battery Voltage: 2.95 V
Brady Statistic AP VP Percent: 1 %
Brady Statistic AS VP Percent: 1 %
Brady Statistic AS VS Percent: 91 %
Brady Statistic RV Percent Paced: 1 %
Implantable Lead Implant Date: 20131106
Implantable Lead Implant Date: 20131106
Implantable Lead Location: 753860
Implantable Lead Model: 1948
Lead Channel Impedance Value: 700 Ohm
Lead Channel Pacing Threshold Amplitude: 1 V
Lead Channel Pacing Threshold Pulse Width: 0.4 ms
Lead Channel Sensing Intrinsic Amplitude: 9.6 mV
Lead Channel Setting Pacing Amplitude: 0.875
Lead Channel Setting Pacing Amplitude: 2 V
Lead Channel Setting Pacing Pulse Width: 0.4 ms
Lead Channel Setting Sensing Sensitivity: 1.5 mV
MDC IDC LEAD LOCATION: 753859
MDC IDC MSMT BATTERY REMAINING LONGEVITY: 119 mo
MDC IDC MSMT BATTERY REMAINING PERCENTAGE: 91 %
MDC IDC MSMT LEADCHNL RA IMPEDANCE VALUE: 360 Ohm
MDC IDC MSMT LEADCHNL RA PACING THRESHOLD PULSEWIDTH: 0.5 ms
MDC IDC MSMT LEADCHNL RA SENSING INTR AMPL: 1 mV
MDC IDC MSMT LEADCHNL RV PACING THRESHOLD AMPLITUDE: 0.625 V
MDC IDC PG SERIAL: 7412730
MDC IDC SESS DTM: 20170921181035
MDC IDC STAT BRADY AP VS PERCENT: 8.4 %
MDC IDC STAT BRADY RA PERCENT PACED: 6.9 %

## 2015-11-05 NOTE — Telephone Encounter (Signed)
Spoke with pt and reminded pt of remote transmission that is due today. Pt verbalized understanding.   

## 2015-11-06 ENCOUNTER — Encounter: Payer: Self-pay | Admitting: Cardiology

## 2015-11-06 NOTE — Progress Notes (Signed)
Remote pacemaker transmission.   

## 2015-11-26 ENCOUNTER — Other Ambulatory Visit (INDEPENDENT_AMBULATORY_CARE_PROVIDER_SITE_OTHER): Payer: Self-pay | Admitting: Vascular Surgery

## 2015-11-26 DIAGNOSIS — I70212 Atherosclerosis of native arteries of extremities with intermittent claudication, left leg: Secondary | ICD-10-CM

## 2015-11-30 ENCOUNTER — Ambulatory Visit (INDEPENDENT_AMBULATORY_CARE_PROVIDER_SITE_OTHER): Payer: Medicare Other | Admitting: Vascular Surgery

## 2015-11-30 ENCOUNTER — Encounter (INDEPENDENT_AMBULATORY_CARE_PROVIDER_SITE_OTHER): Payer: Self-pay | Admitting: Vascular Surgery

## 2015-11-30 ENCOUNTER — Ambulatory Visit (INDEPENDENT_AMBULATORY_CARE_PROVIDER_SITE_OTHER): Payer: Medicare Other

## 2015-11-30 VITALS — BP 115/71 | HR 69 | Resp 16 | Ht 74.0 in | Wt 206.0 lb

## 2015-11-30 DIAGNOSIS — I739 Peripheral vascular disease, unspecified: Secondary | ICD-10-CM

## 2015-11-30 DIAGNOSIS — I70212 Atherosclerosis of native arteries of extremities with intermittent claudication, left leg: Secondary | ICD-10-CM

## 2015-11-30 DIAGNOSIS — E785 Hyperlipidemia, unspecified: Secondary | ICD-10-CM | POA: Insufficient documentation

## 2015-11-30 DIAGNOSIS — C61 Malignant neoplasm of prostate: Secondary | ICD-10-CM | POA: Diagnosis not present

## 2015-11-30 DIAGNOSIS — Z89619 Acquired absence of unspecified leg above knee: Secondary | ICD-10-CM | POA: Insufficient documentation

## 2015-11-30 DIAGNOSIS — Z89611 Acquired absence of right leg above knee: Secondary | ICD-10-CM

## 2015-11-30 DIAGNOSIS — E782 Mixed hyperlipidemia: Secondary | ICD-10-CM

## 2015-11-30 DIAGNOSIS — I70219 Atherosclerosis of native arteries of extremities with intermittent claudication, unspecified extremity: Secondary | ICD-10-CM | POA: Insufficient documentation

## 2015-11-30 NOTE — Progress Notes (Signed)
MRN : [id number]  [name] is a 82 y.o. ([birth year]) male who presents with chief complaint of  Chief Complaint  Patient presents with  . Follow-up  .  History of Present Illness:  The patient returns to the office for followup and review of the noninvasive studies. There have been no interval changes in his left lower extremity symptoms. No interval shortening of the patient's claudication distance or development of rest pain symptoms. No new ulcers or wounds have occurred since the last visit.  There have been no significant changes to the patient's overall health care.  His prostate CA appears to be well controlled  The patient denies amaurosis fugax or recent TIA symptoms. There are no recent neurological changes noted. The patient denies history of DVT, PE or superficial thrombophlebitis. The patient denies recent episodes of angina or shortness of breath.  ABI's Rt=AKA and Lt=0.72 (ABI's Rt=AKA and Lt=0.94 )  Current Outpatient Prescriptions  Medication Sig Dispense Refill  . atenolol (TENORMIN) 25 MG tablet Take 25 mg by mouth daily.    Marland Kitchen atorvastatin (LIPITOR) 10 MG tablet Take 10 mg by mouth daily.     Marland Kitchen imipramine (TOFRANIL) 25 MG tablet Take 50 mg by mouth every morning.    . Multiple Vitamin (MULTIVITAMIN WITH MINERALS) TABS Take 1 tablet by mouth 2 (two) times daily.    [name] 40 MG capsule TAKE 4 CAPSULES (160 MG TOTAL) BY MOUTH ONCE DAILY AT THE SAME TIME. MAY TAKE WITH OR WITHOUT FOOD. SWALLOW WHOLE. 120 capsule 2   No current facility-administered medications for this visit.     Past Medical History:  Diagnosis Date  . Atrioventricular block, complete 12/21/2011  . Cancer [facility])    prostate 2007  . Headache(784.0)    migraines w/o HA; auras  . Hypertension   . Multiple lung nodules 01/2012  . Pacemaker-St.Jude 12/22/2011    Past Surgical History:  Procedure Laterality Date  . CHOLECYSTECTOMY  04/04/13   open  . HERNIA REPAIR     inguinal hernia    . INSERT / REPLACE / REMOVE PACEMAKER  12/2011   St. Jude  . JOINT REPLACEMENT     both R & L replacement;  then R AKA  . LEG AMPUTATION ABOVE KNEE  2009   R leg  . PERMANENT PACEMAKER INSERTION N/A 12/21/2011   Procedure: PERMANENT PACEMAKER INSERTION;  Surgeon: Thompson Grayer, MD;  Location: [facility] CATH LAB;  Service: Cardiovascular;  Laterality: N/A;  . PROSTATECTOMY  2007    Social History Social History  Substance Use Topics  . Smoking status: Former Smoker    Types: Cigarettes    Quit date: 02/14/1978  . Smokeless tobacco: Former Systems developer    Quit date: 02/14/1978  . Alcohol use No     Allergies  Allergen Reactions  . Ace Inhibitors Cough  . Statins Other (See Comments)    CK elevation     REVIEW OF SYSTEMS (Negative unless checked)  Constitutional: [] Weight loss  [] Fever  [] Chills Cardiac: [] Chest pain   [] Chest pressure   [] Palpitations   [] Shortness of breath at rest   [] Shortness of breath with exertion. Vascular:  [x] Pain in legs with walking   [] Pain in legs at rest    [] Pain in feet at rest    [] History of DVT   [] Phlebitis   [] Swelling in legs   [] Varicose veins   [] Non-healing ulcers Pulmonary:   [] Uses home oxygen   [] Productive cough   [] Hemoptysis   []   Wheeze  [] COPD   [] Asthma Neurologic:  [] Dizziness  [] Blackouts   [] Seizures   [] History of stroke   [] History of TIA  [] Aphasia   [] Temporary blindness   [] Dysphagia   [] Weakness or numbness in arm   [] Weakness or numbness in leg Musculoskeletal:  [x] Arthritis   [] Joint swelling   [] Joint pain   [] Low back pain Hematologic:  [] Easy bruising  [] Easy bleeding   [] Hypercoagulable state   [] Anemic   Gastrointestinal:  [] Blood in stool   [] Vomiting blood  [] Gastroesophageal reflux/heartburn   [] Difficulty swallowing. Genitourinary:  [x] Chronic kidney disease   [] Difficult urination  [] Frequent urination  [] Burning with urination   [] Blood in urine Skin:  [] Rashes   [] Ulcers   Psychological:  [] History of anxiety   []  History  of major depression.    Physical Examination  Vitals:   11/30/15 1101  BP: 115/71  Pulse: 69  Resp: 16  Weight: 206 lb (93.4 kg)  Height: 6\' 2"  (1.88 m)   Body mass index is 26.45 kg/m. Gen:  WD/WN, NAD Head: [place]/AT, No temporalis wasting. Ear/Nose/Throat: Hearing grossly intact, nares w/o erythema or drainage, trachea midline Eyes: PERR, EOM appear normal. Sclera non-icteric Neck: Supple, no nuchal rigidity.  No bruit or JVD.  Pulmonary:  Good air movement, equal and clear to auscultation bilaterally.  Cardiac: RRR, normal S1, S2, no Murmurs, rubs or gallops. Vascular:    Vessel Right Left  Radial Palpable Palpable  Ulnar Palpable Palpable  Brachial Palpable Palpable  Carotid Palpable Palpable  Aorta Not palpable N/A  Femoral Palpable Palpable  Popliteal AKA Not Palpable  PT AKA Not Palpable  DP AKA Not Palpable   Gastrointestinal: soft, non-rigid/non-distended. No guarding.  Musculoskeletal: M/S 5/5 throughout except right AKA.  No atrophy right AKA. Neurologic: CN 2-12 intact. Pain and light touch intact in extremities.  Speech is fluent. Motor exam as listed above. Psychiatric: Judgment intact, Mood & affect appropriate for pt's clinical situation. Dermatologic: No rashes or ulcers noted.  No signs consistent with cellulitis. Lymphatic:  No cervical lymphadenopathy  CBC Lab Results  Component Value Date   WBC 6.2 07/29/2015   HGB 15.2 07/29/2015   HCT 44.1 07/29/2015   MCV 96.6 07/29/2015   PLT 191 07/29/2015    BMET    Component Value Date/Time   NA 139 07/29/2015 0921   NA 136 05/16/2014 0939   K 4.8 07/29/2015 0921   K 4.6 05/16/2014 0939   CL 103 07/29/2015 0921   CL 104 05/16/2014 0939   CO2 28 07/29/2015 0921   CO2 28 05/16/2014 0939   GLUCOSE 130 (H) 07/29/2015 0921   GLUCOSE 90 05/16/2014 0939   BUN 23 (H) 07/29/2015 0921   BUN 23 (H) 05/16/2014 0939   CREATININE 0.87 07/29/2015 0921   CREATININE 0.87 05/16/2014 0939   CALCIUM 9.6  07/29/2015 0921   CALCIUM 9.2 05/16/2014 0939   GFRNONAA >60 07/29/2015 0921   GFRNONAA >60 05/16/2014 0939   GFRAA >60 07/29/2015 0921   GFRAA >60 05/16/2014 0939   CrCl cannot be calculated (Patient's most recent lab result is older than the maximum 21 days allowed.).  COAG Lab Results  Component Value Date   INR 1.7 04/04/2013   INR 0.9 02/27/2012   INR 1.07 12/21/2011    Radiology No results found.    Assessment/Plan   1.  Atherosclerosis of native artery of left lower extremity with intermittent claudication (440.21  I70.212) Impression: Recommend:  The patient has evidence of atherosclerosis of  the lower extremity with claudication. The patient does not voice changes at this point in time.  However, noninvasive studies do suggest clinically significant change with a 20% decrease on the left  I will order an arterial duplex to assess his left leg given the decrease in his ABI and the right sided AKA.   The patient should continue walking and begin a more formal exercise program. The patient should continue antiplatelet therapy and aggressive treatment of the lipid abnormalities  2.  Above knee amputation status (V49.76  Z89.619) Impression: He notes his prosthesis has been working well but he still has episodes of phantom pain.  3.  Prostate cancer (185  C61) Impression: NOTE: The patient's prostate cancer is now metastatic and he has lung mets.  His PSA is under control  4.  Hyperlipidemia (272.4  E78.5) Continue statin  5.  Hypercholesterolemia (272.0  E78.0) Continue statin  Hortencia Pilar, MD  11/30/2015 11:23 AM    This note was created with Dragon medical transcription system.  Any errors from dictation are purely unintentional

## 2015-12-02 ENCOUNTER — Inpatient Hospital Stay (HOSPITAL_BASED_OUTPATIENT_CLINIC_OR_DEPARTMENT_OTHER): Payer: Medicare Other | Admitting: Oncology

## 2015-12-02 ENCOUNTER — Inpatient Hospital Stay: Payer: Medicare Other | Attending: Oncology

## 2015-12-02 VITALS — BP 120/73 | HR 84 | Temp 97.3°F | Resp 18 | Wt 200.2 lb

## 2015-12-02 DIAGNOSIS — C61 Malignant neoplasm of prostate: Secondary | ICD-10-CM | POA: Diagnosis not present

## 2015-12-02 DIAGNOSIS — I442 Atrioventricular block, complete: Secondary | ICD-10-CM | POA: Insufficient documentation

## 2015-12-02 DIAGNOSIS — Z87891 Personal history of nicotine dependence: Secondary | ICD-10-CM

## 2015-12-02 DIAGNOSIS — Z79899 Other long term (current) drug therapy: Secondary | ICD-10-CM

## 2015-12-02 DIAGNOSIS — I10 Essential (primary) hypertension: Secondary | ICD-10-CM

## 2015-12-02 DIAGNOSIS — C7800 Secondary malignant neoplasm of unspecified lung: Secondary | ICD-10-CM

## 2015-12-02 DIAGNOSIS — R300 Dysuria: Secondary | ICD-10-CM | POA: Insufficient documentation

## 2015-12-02 DIAGNOSIS — R918 Other nonspecific abnormal finding of lung field: Secondary | ICD-10-CM

## 2015-12-02 DIAGNOSIS — R5383 Other fatigue: Secondary | ICD-10-CM | POA: Diagnosis not present

## 2015-12-02 DIAGNOSIS — K5900 Constipation, unspecified: Secondary | ICD-10-CM | POA: Insufficient documentation

## 2015-12-02 DIAGNOSIS — R531 Weakness: Secondary | ICD-10-CM | POA: Diagnosis not present

## 2015-12-02 DIAGNOSIS — Z9049 Acquired absence of other specified parts of digestive tract: Secondary | ICD-10-CM | POA: Diagnosis not present

## 2015-12-02 LAB — PSA: PSA: <0.01 ng/mL (ref 0.00–4.00)

## 2015-12-02 LAB — URINALYSIS COMPLETE WITH MICROSCOPIC (ARMC ONLY)
BACTERIA UA: NONE SEEN
Bilirubin Urine: NEGATIVE
GLUCOSE, UA: NEGATIVE mg/dL
HGB URINE DIPSTICK: NEGATIVE
LEUKOCYTES UA: NEGATIVE
Nitrite: NEGATIVE
Protein, ur: NEGATIVE mg/dL
SQUAMOUS EPITHELIAL / LPF: NONE SEEN
Specific Gravity, Urine: 1.024 (ref 1.005–1.030)
pH: 5 (ref 5.0–8.0)

## 2015-12-02 LAB — CBC WITH DIFFERENTIAL/PLATELET
BASOS ABS: 0.1 10*3/uL (ref 0–0.1)
Basophils Relative: 1 %
Eosinophils Absolute: 0.2 10*3/uL (ref 0–0.7)
Eosinophils Relative: 3 %
HEMATOCRIT: 41.1 % (ref 40.0–52.0)
HEMOGLOBIN: 14.3 g/dL (ref 13.0–18.0)
LYMPHS PCT: 21 %
Lymphs Abs: 1.2 10*3/uL (ref 1.0–3.6)
MCH: 33.3 pg (ref 26.0–34.0)
MCHC: 34.7 g/dL (ref 32.0–36.0)
MCV: 95.9 fL (ref 80.0–100.0)
MONO ABS: 0.5 10*3/uL (ref 0.2–1.0)
MONOS PCT: 9 %
NEUTROS ABS: 3.9 10*3/uL (ref 1.4–6.5)
NEUTROS PCT: 66 %
Platelets: 180 10*3/uL (ref 150–440)
RBC: 4.29 MIL/uL — ABNORMAL LOW (ref 4.40–5.90)
RDW: 12.7 % (ref 11.5–14.5)
WBC: 6 10*3/uL (ref 3.8–10.6)

## 2015-12-02 LAB — COMPREHENSIVE METABOLIC PANEL
ALBUMIN: 4.1 g/dL (ref 3.5–5.0)
ALK PHOS: 54 U/L (ref 38–126)
ALT: 14 U/L — ABNORMAL LOW (ref 17–63)
AST: 23 U/L (ref 15–41)
Anion gap: 10 (ref 5–15)
BILIRUBIN TOTAL: 0.9 mg/dL (ref 0.3–1.2)
BUN: 18 mg/dL (ref 6–20)
CALCIUM: 9.1 mg/dL (ref 8.9–10.3)
CO2: 25 mmol/L (ref 22–32)
Chloride: 102 mmol/L (ref 101–111)
Creatinine, Ser: 0.69 mg/dL (ref 0.61–1.24)
GFR calc Af Amer: >60 mL/min (ref >60)
GLUCOSE: 137 mg/dL — AB (ref 65–99)
POTASSIUM: 4.2 mmol/L (ref 3.5–5.1)
Sodium: 137 mmol/L (ref 135–145)
TOTAL PROTEIN: 6.5 g/dL (ref 6.5–8.1)

## 2015-12-02 NOTE — Progress Notes (Signed)
States has tender breasts, thin skin, occasional constipation, and occasional burning with urination. Pt has remained complaint with xtandi and has not missed any doses.

## 2015-12-02 NOTE — Progress Notes (Signed)
[place]  [phone] Fax:([phone]) [phone]  ID: Corky Mull OB: 1930/07/27  MR#: [id number]  [id number]  Patient Care Team: Albina Billet, MD as PCP - General (Unknown Physician Specialty) Christene Lye, MD (General Surgery)  CHIEF COMPLAINT: Stage IV prostate cancer with biopsy confirmed metastasis to lung.  INTERVAL HISTORY: Patient returns to clinic today for further evaluation and laboratory work. He continues to tolerate Xtandi with only some mild fatigue. He has occasional constipation. He does not complain of hot flashes today. He has occasional burning with urination.  He has no neurologic complaints. He denies any recent fevers or illnesses. He denies any chest pain or shortness of breath.  He has a good appetite and denies weight loss.  He denies any nausea, vomiting, or diarrhea. Patient offers no further specific complaints today.  REVIEW OF SYSTEMS:   Review of Systems  Constitutional: Positive for malaise/fatigue. Negative for fever.  Respiratory: Negative.  Negative for shortness of breath.   Cardiovascular: Negative.  Negative for chest pain.  Gastrointestinal: Positive for constipation.  Genitourinary: Positive for dysuria and frequency.  Musculoskeletal: Negative.   Neurological: Negative.   Psychiatric/Behavioral: Negative.  The patient is not nervous/anxious.     As per HPI. Otherwise, a complete review of systems is negative.  PAST MEDICAL HISTORY: Past Medical History:  Diagnosis Date  . Atrioventricular block, complete 12/21/2011  . Cancer [facility])    prostate 2007  . Headache(784.0)    migraines w/o HA; [place]  . Hypertension   . Multiple lung nodules 01/2012  . Pacemaker-St.Jude 12/22/2011    PAST SURGICAL HISTORY: Past Surgical History:  Procedure Laterality Date  . CHOLECYSTECTOMY  04/04/13   open  . HERNIA REPAIR     inguinal hernia  . INSERT / REPLACE / REMOVE PACEMAKER  12/2011   St. Jude  . JOINT  REPLACEMENT     both R & L replacement;  then R AKA  . LEG AMPUTATION ABOVE KNEE  2009   R leg  . PERMANENT PACEMAKER INSERTION N/A 12/21/2011   Procedure: PERMANENT PACEMAKER INSERTION;  Surgeon: Thompson Grayer, MD;  Location: [facility] CATH LAB;  Service: Cardiovascular;  Laterality: N/A;  . PROSTATECTOMY  2007    FAMILY HISTORY: Breast cancer.     ADVANCED DIRECTIVES:    HEALTH MAINTENANCE: Social History  Substance Use Topics  . Smoking status: Former Smoker    Types: Cigarettes    Quit date: 02/14/1978  . Smokeless tobacco: Former Systems developer    Quit date: 02/14/1978  . Alcohol use No     Colonoscopy:  PAP:  Bone density:  Lipid panel:  Allergies  Allergen Reactions  . Ace Inhibitors Cough  . Statins Other (See Comments)    CK elevation    Current Outpatient Prescriptions  Medication Sig Dispense Refill  . atenolol (TENORMIN) 25 MG tablet Take 25 mg by mouth daily.    Marland Kitchen atorvastatin (LIPITOR) 10 MG tablet Take 10 mg by mouth daily.     Marland Kitchen imipramine (TOFRANIL) 25 MG tablet Take 50 mg by mouth every morning.    . Multiple Vitamin (MULTIVITAMIN WITH MINERALS) TABS Take 1 tablet by mouth 2 (two) times daily.    [name] 40 MG capsule TAKE 4 CAPSULES (160 MG TOTAL) BY MOUTH ONCE DAILY AT THE SAME TIME. MAY TAKE WITH OR WITHOUT FOOD. SWALLOW WHOLE. 120 capsule 2   No current facility-administered medications for this visit.     OBJECTIVE: Vitals:   12/02/15 1019  BP: 120/73  Pulse: 84  Resp: 18  Temp: 97.3 F (36.3 C)     Body mass index is 25.7 kg/m.    ECOG FS: 1  General: Well-developed, well-nourished, no acute distress. Sitting in a wheelchair. Eyes: anicteric sclera. Lungs: Clear to auscultation bilaterally. Heart: Regular rate and rhythm. No rubs, murmurs, or gallops. Abdomen: Soft, nontender, nondistended. No organomegaly noted, normoactive bowel sounds. Musculoskeletal: No edema, cyanosis, or clubbing. Right BKA with prosthetic leg noted. Neuro: Alert, answering  all questions appropriately. Cranial nerves grossly intact. Skin: No rashes or petechiae noted. Psych: Normal affect.    LAB RESULTS:  Lab Results  Component Value Date   NA 137 12/02/2015   K 4.2 12/02/2015   CL 102 12/02/2015   CO2 25 12/02/2015   GLUCOSE 137 (H) 12/02/2015   BUN 18 12/02/2015   CREATININE 0.69 12/02/2015   CALCIUM 9.1 12/02/2015   PROT 6.5 12/02/2015   ALBUMIN 4.1 12/02/2015   AST 23 12/02/2015   ALT 14 (L) 12/02/2015   ALKPHOS 54 12/02/2015   BILITOT 0.9 12/02/2015   GFRNONAA >60 12/02/2015   GFRAA >60 12/02/2015    Lab Results  Component Value Date   WBC 6.0 12/02/2015   NEUTROABS 3.9 12/02/2015   HGB 14.3 12/02/2015   HCT 41.1 12/02/2015   MCV 95.9 12/02/2015   PLT 180 12/02/2015   Lab Results  Component Value Date   PSA <0.01 12/02/2015     STUDIES: No results found.  ASSESSMENT: Stage IV prostate cancer with biopsy confirmed metastasis to lung.  PLAN:    1.  Stage IV prostate cancer with biopsy confirmed metastasis to lung:  CT scan results from April 21, 2015 reviewed independently and reported continue positive response to therapy as demonstrated by the decrease in size of numerous pulmonary metastasis. No new metastatic lesions were noted. Patient's PSA is also undetectable at less than 0.01.  Lung nodules previously confirmed positive for prostate cancer by biopsy. Continue 160 mg oral Xtandi daily. Patient does not require any further imaging unless there is suspicion of recurrence or PSA begins to increase. Return to clinic in 4 months with repeat laboratory work and further evaluation.  2. Weakness/fatigue: Likely multifactorial, monitor. 3. Constipation: Continue OTC treatments as needed. 4. Burning with urination: Patient's UA and culture are negative to date.  Patient expressed understanding and was in agreement with this plan. He also understands that He can call clinic at any time with any questions, concerns, or complaints.      Lloyd Huger, MD   12/05/2015 7:44 AM

## 2015-12-03 ENCOUNTER — Telehealth: Payer: Self-pay | Admitting: *Deleted

## 2015-12-03 LAB — URINE CULTURE: CULTURE: NO GROWTH

## 2015-12-03 NOTE — Telephone Encounter (Signed)
-----   Message from Lloyd Huger, MD sent at 12/03/2015  1:51 PM EDT ----- He does not appear to have a UTI.  Will await culture results.   ----- Message ----- From: Telford Nab, RN Sent: 12/03/2015  10:05 AM To: Lloyd Huger, MD  UA has resulted and culture is still pending. Let me know what you want me to tell him regarding these results. Thanks!

## 2015-12-03 NOTE — Telephone Encounter (Signed)
Results given to patient and pt was informed that would be notified of culture results once reported back.

## 2015-12-10 ENCOUNTER — Ambulatory Visit (INDEPENDENT_AMBULATORY_CARE_PROVIDER_SITE_OTHER): Payer: Medicare Other | Admitting: Vascular Surgery

## 2015-12-10 ENCOUNTER — Ambulatory Visit (INDEPENDENT_AMBULATORY_CARE_PROVIDER_SITE_OTHER): Payer: Medicare Other

## 2015-12-10 ENCOUNTER — Encounter (INDEPENDENT_AMBULATORY_CARE_PROVIDER_SITE_OTHER): Payer: Self-pay | Admitting: Vascular Surgery

## 2015-12-10 VITALS — BP 125/67 | HR 69 | Resp 16 | Ht 74.0 in | Wt 201.0 lb

## 2015-12-10 DIAGNOSIS — E782 Mixed hyperlipidemia: Secondary | ICD-10-CM | POA: Diagnosis not present

## 2015-12-10 DIAGNOSIS — I10 Essential (primary) hypertension: Secondary | ICD-10-CM

## 2015-12-10 DIAGNOSIS — I739 Peripheral vascular disease, unspecified: Secondary | ICD-10-CM

## 2015-12-10 DIAGNOSIS — I70212 Atherosclerosis of native arteries of extremities with intermittent claudication, left leg: Secondary | ICD-10-CM | POA: Diagnosis not present

## 2015-12-10 NOTE — Progress Notes (Signed)
Subjective:    Patient ID: [name], male    DOB: 1930-11-04, 82 y.o.   MRN: [id number] Chief Complaint  Patient presents with  . Follow-up    Patient presents to review vascular studies. He was last seen on 11/30/15 and noted on ABI to have a 20% decrease on the left. He was brought back today for a LLE arterial duplex which was notable for Right: Triphasic blood flow to mid-thigh then occlusion of SFA distally, patent profunda. Patient reports no issues with his right stump. Skin intact. Left: Triphasic blood flow distally, bi-phasic mid-calf. Patient continues to deny any claudication like symptoms, rest pain or ulcers to his feet / toes.    Review of Systems Constitutional: [] Weight loss  [] Fever  [] Chills Cardiac: [] Chest pain   [] Chest pressure   [] Palpitations   [] Shortness of breath at rest   [] Shortness of breath with exertion. Vascular:  [x] Pain in legs with walking   [] Pain in legs at rest    [] Pain in feet at rest    [] History of DVT   [] Phlebitis   [] Swelling in legs   [] Varicose veins   [] Non-healing ulcers Pulmonary:   [] Uses home oxygen   [] Productive cough   [] Hemoptysis   [] Wheeze  [] COPD   [] Asthma Neurologic:  [] Dizziness  [] Blackouts   [] Seizures   [] History of stroke   [] History of TIA  [] Aphasia   [] Temporary blindness   [] Dysphagia   [] Weakness or numbness in arm   [] Weakness or numbness in leg Musculoskeletal:  [x] Arthritis   [] Joint swelling   [] Joint pain   [] Low back pain Hematologic:  [] Easy bruising  [] Easy bleeding   [] Hypercoagulable state   [] Anemic   Gastrointestinal:  [] Blood in stool   [] Vomiting blood  [] Gastroesophageal reflux/heartburn   [] Difficulty swallowing. Genitourinary:  [x] Chronic kidney disease   [] Difficult urination  [] Frequent urination  [] Burning with urination   [] Blood in urine Skin:  [] Rashes   [] Ulcers   Psychological:  [] History of anxiety   []  History of major depression.    Objective:   Physical Exam Gen:  WD/WN, NAD Head:  [place]/AT, No temporalis wasting. Ear/Nose/Throat: Hearing grossly intact, nares w/o erythema or drainage, trachea midline Eyes: PERR, EOM appear normal. Sclera non-icteric Neck: Supple, no nuchal rigidity.  No bruit or JVD.  Pulmonary:  Good air movement, equal and clear to auscultation bilaterally.  Cardiac: RRR, normal S1, S2, no Murmurs, rubs or gallops. Vascular:    Vessel Right Left  Radial Palpable Palpable  Ulnar Palpable Palpable  Brachial Palpable Palpable  Carotid Palpable Palpable  Aorta Not palpable N/A  Femoral Palpable Palpable  Popliteal AKA Not Palpable  PT AKA Not Palpable  DP AKA Not Palpable   Gastrointestinal: soft, non-rigid/non-distended. No guarding.  Musculoskeletal: M/S 5/5 throughout except right AKA.  No atrophy right AKA. Neurologic: CN 2-12 intact. Pain and light touch intact in extremities.  Speech is fluent. Motor exam as listed above. Psychiatric: Judgment intact, Mood & affect appropriate for pt's clinical situation. Dermatologic: No rashes or ulcers noted.  No signs consistent with cellulitis. Lymphatic:  No cervical lymphadenopathy  BP 125/67   Pulse 69   Resp 16   Ht 6\' 2"  (1.88 m)   Wt 201 lb (91.2 kg)   BMI 25.81 kg/m   Past Medical History:  Diagnosis Date  . Atrioventricular block, complete 12/21/2011  . Cancer [facility])    prostate 2007  . Headache(784.0)    migraines w/o HA; [place]  .  Hypertension   . Multiple lung nodules 01/2012  . Pacemaker-St.Jude 12/22/2011   Social History   Social History  . Marital status: Married    Spouse name: N/A  . Number of children: N/A  . Years of education: N/A   Occupational History  . Not on file.   Social History Main Topics  . Smoking status: Former Smoker    Types: Cigarettes    Quit date: 02/14/1978  . Smokeless tobacco: Former Systems developer    Quit date: 02/14/1978  . Alcohol use No  . Drug use: No  . Sexual activity: Not on file   Other Topics Concern  . Not on file   Social History  Narrative  . No narrative on file   Past Surgical History:  Procedure Laterality Date  . CHOLECYSTECTOMY  04/04/13   open  . HERNIA REPAIR     inguinal hernia  . INSERT / REPLACE / REMOVE PACEMAKER  12/2011   St. Jude  . JOINT REPLACEMENT     both R & L replacement;  then R AKA  . LEG AMPUTATION ABOVE KNEE  2009   R leg  . PERMANENT PACEMAKER INSERTION N/A 12/21/2011   Procedure: PERMANENT PACEMAKER INSERTION;  Surgeon: Thompson Grayer, MD;  Location: [facility] CATH LAB;  Service: Cardiovascular;  Laterality: N/A;  . PROSTATECTOMY  2007   Family History  Problem Relation Age of Onset  . Hyperlipidemia Mother   . Cancer Mother   . Congestive Heart Failure Mother   . Heart attack Father    Allergies  Allergen Reactions  . Ace Inhibitors Cough  . Statins Other (See Comments)    CK elevation      Assessment & Plan:  Patient presents to review vascular studies. He was last seen on 11/30/15 and noted on ABI to have a 20% decrease on the left. He was brought back today for a LLE arterial duplex which was notable for Right: Triphasic blood flow to mid-thigh then occlusion of SFA distally, patent profunda. Patient reports no issues with his right stump. Skin intact. Left: Triphasic blood flow distally, bi-phasic mid-calf. Patient continues to deny any claudication like symptoms, rest pain or ulcers to his feet / toes.   1. Essential hypertension - Stable Encouraged good control as its slows the progression of atherosclerotic disease  2. Mixed hyperlipidemia - Stable On Statin for medical optimization. Encouraged good control as its slows the progression of atherosclerotic disease  3. Peripheral vascular disease ([place]) - Stable Studies reviewed with patient. Asymptomatic at this time. Triphasic blood flow distally, bi-phasic mid-calf to foot. No indication for intervention at this time.  Patient to follow up in six months with ABI and bilateral LE arterial duplex. Patient to continue medical  optimization with dyslipidemia medication. Patient to remain abstinent of tobacco use. I have discussed with the patient at length the risk factors for and pathogenesis of atherosclerotic disease and encouraged a healthy diet, regular exercise regimen and blood pressure / glucose control.  The patient was encouraged to call the office in the interim if he experiences any claudication like symptoms, rest pain or ulcers to his feet / toes.  Current Outpatient Prescriptions on File Prior to Visit  Medication Sig Dispense Refill  . atenolol (TENORMIN) 25 MG tablet Take 25 mg by mouth daily.    Marland Kitchen atorvastatin (LIPITOR) 10 MG tablet Take 10 mg by mouth daily.     Marland Kitchen imipramine (TOFRANIL) 25 MG tablet Take 50 mg by mouth every morning.    Marland Kitchen  Multiple Vitamin (MULTIVITAMIN WITH MINERALS) TABS Take 1 tablet by mouth 2 (two) times daily.    [name] 40 MG capsule TAKE 4 CAPSULES (160 MG TOTAL) BY MOUTH ONCE DAILY AT THE SAME TIME. MAY TAKE WITH OR WITHOUT FOOD. SWALLOW WHOLE. 120 capsule 2   No current facility-administered medications on file prior to visit.     There are no Patient Instructions on file for this visit. No Follow-up on file.   Mckynna Vanloan A Onalee Steinbach, PA-C

## 2016-01-26 ENCOUNTER — Encounter: Payer: Medicare Other | Admitting: Internal Medicine

## 2016-02-02 ENCOUNTER — Encounter: Payer: Medicare Other | Admitting: Internal Medicine

## 2016-02-16 ENCOUNTER — Other Ambulatory Visit: Payer: Self-pay | Admitting: Oncology

## 2016-03-08 ENCOUNTER — Encounter: Payer: Self-pay | Admitting: Internal Medicine

## 2016-03-08 ENCOUNTER — Ambulatory Visit (INDEPENDENT_AMBULATORY_CARE_PROVIDER_SITE_OTHER): Payer: Medicare Other | Admitting: Internal Medicine

## 2016-03-08 VITALS — BP 110/60 | HR 91 | Ht 74.0 in | Wt 186.5 lb

## 2016-03-08 DIAGNOSIS — Z950 Presence of cardiac pacemaker: Secondary | ICD-10-CM | POA: Diagnosis not present

## 2016-03-08 DIAGNOSIS — I441 Atrioventricular block, second degree: Secondary | ICD-10-CM

## 2016-03-08 DIAGNOSIS — I442 Atrioventricular block, complete: Secondary | ICD-10-CM

## 2016-03-08 LAB — CUP PACEART INCLINIC DEVICE CHECK
Brady Statistic RA Percent Paced: 6.5 %
Implantable Lead Implant Date: 20131106
Implantable Lead Location: 753859
Implantable Pulse Generator Implant Date: 20131106
Lead Channel Impedance Value: 687.5 Ohm
Lead Channel Pacing Threshold Amplitude: 0.625 V
Lead Channel Pacing Threshold Pulse Width: 0.4 ms
Lead Channel Pacing Threshold Pulse Width: 0.5 ms
Lead Channel Setting Pacing Amplitude: 2 V
Lead Channel Setting Sensing Sensitivity: 1.5 mV
MDC IDC LEAD IMPLANT DT: 20131106
MDC IDC LEAD LOCATION: 753860
MDC IDC MSMT BATTERY VOLTAGE: 2.95 V
MDC IDC MSMT LEADCHNL RA IMPEDANCE VALUE: 362.5 Ohm
MDC IDC MSMT LEADCHNL RA PACING THRESHOLD AMPLITUDE: 0.75 V
MDC IDC MSMT LEADCHNL RA SENSING INTR AMPL: 2.2 mV
MDC IDC MSMT LEADCHNL RV SENSING INTR AMPL: 12 mV
MDC IDC SESS DTM: 20180123162812
MDC IDC SET LEADCHNL RV PACING AMPLITUDE: 0.875
MDC IDC SET LEADCHNL RV PACING PULSEWIDTH: 0.4 ms
MDC IDC STAT BRADY RV PERCENT PACED: 0.43 %
Pulse Gen Serial Number: 7412730

## 2016-03-08 NOTE — Patient Instructions (Addendum)
Medication Instructions: - Your physician recommends that you continue on your current medications as directed. Please refer to the Current Medication list given to you today.  Labwork: - none ordered  Procedures/Testing: - none ordered  Follow-Up: - Remote monitoring is used to monitor your Pacemaker of ICD from home. This monitoring reduces the number of office visits required to check your device to one time per year. It allows [place] to keep an eye on the functioning of your device to ensure it is working properly. You are scheduled for a device check from home on 06/07/16. You may send your transmission at any time that day. If you have a wireless device, the transmission will be sent automatically. After your physician reviews your transmission, you will receive a postcard with your next transmission date.  - Your physician wants you to follow-up in: 1 year with Dr. Caryl Comes. You will receive a reminder letter in the mail two months in advance. If you don't receive a letter, please call our office to schedule the follow-up appointment.  Any Additional Special Instructions Will Be Listed Below (If Applicable).     If you need a refill on your cardiac medications before your next appointment, please call your pharmacy.

## 2016-03-08 NOTE — Progress Notes (Signed)
Patient Care Team: Albina Billet, MD as PCP - General (Unknown Physician Specialty) Christene Lye, MD (General Surgery)   HPI  [name] is a 83 y.o. male Seen in followup for PM implanted 11/13 for syncope and intermittent complete heart block   A chest CT confirmed numerous bilateral pulmonary nodules highly suspicious for metastatic dz. He underwent biopsy>>metastatic prostate Ca and on hormone therapy     Patient has prior AKA and employs the use of a walker for ambulation He is now largely wheelchair-bound.  His wife has fallen broke her hip following a knee replacement surgery they are moving into a one story house     Past Medical History:  Diagnosis Date  . Atrioventricular block, complete 12/21/2011  . Cancer [facility])    prostate 2007  . Headache(784.0)    migraines w/o HA; [place]  . Hypertension   . Multiple lung nodules 01/2012  . Pacemaker-St.Jude 12/22/2011    Past Surgical History:  Procedure Laterality Date  . CHOLECYSTECTOMY  04/04/13   open  . HERNIA REPAIR     inguinal hernia  . INSERT / REPLACE / REMOVE PACEMAKER  12/2011   St. Jude  . JOINT REPLACEMENT     both R & L replacement;  then R AKA  . LEG AMPUTATION ABOVE KNEE  2009   R leg  . PERMANENT PACEMAKER INSERTION N/A 12/21/2011   Procedure: PERMANENT PACEMAKER INSERTION;  Surgeon: Thompson Grayer, MD;  Location: [facility] CATH LAB;  Service: Cardiovascular;  Laterality: N/A;  . PROSTATECTOMY  2007    Current Outpatient Prescriptions  Medication Sig Dispense Refill  . atenolol (TENORMIN) 25 MG tablet Take 25 mg by mouth daily.    Marland Kitchen atorvastatin (LIPITOR) 10 MG tablet Take 10 mg by mouth daily.     Marland Kitchen imipramine (TOFRANIL) 25 MG tablet Take 50 mg by mouth every morning.    . Multiple Vitamin (MULTIVITAMIN WITH MINERALS) TABS Take 1 tablet by mouth 2 (two) times daily.    [name] 40 MG capsule TAKE 4 CAPSULES (160 MG TOTAL) BY MOUTH ONCE DAILY AT THE SAME TIME. MAY TAKE WITH OR WITHOUT FOOD.  SWALLOW WHOLE. 120 capsule 2   No current facility-administered medications for this visit.     Allergies  Allergen Reactions  . Ace Inhibitors Cough  . Statins Other (See Comments)    CK elevation    Review of Systems negative except from HPI and PMH  Physical Exam BP 110/60 (BP Location: Left Arm, Patient Position: Sitting, Cuff Size: Normal)   Pulse 91   Ht 6\' 2"  (1.88 m)   Wt 186 lb 8 oz (84.6 kg)   BMI 23.95 kg/m  Well developed and cachectic age appearing Caucasian male in no acute distress sitting in a wheelchair HENT normal E scleral and icterus clear Neck Supple Coarse breath sounds and distant Device pocket well healed; without hematoma or erythema.  There is no tethering  Regular rate and rhythm,2/6 murmuer with split s2. gallops or rub Soft with active bowel sounds No clubbing cyanosis  No  Edema status post right lower leg amputation Alert and oriented, grossly normal motor and sensory function Skin Warm    ECG demonstrates sinus rhythm with frequent PVCs Intervals 22/14/44 Axis left -60 Right bundle branch block  Assessment and  Plan  Syncope  Pacemaker St. Jude  The patient's device was interrogated.  The information was reviewed. No changes were made in the programming.  Ventricular ectopy  No syncope   I suggested that they talk with Dr. Hall Busing regarding physical therapy as he has been sitting in a wheelchair much of the last 2 months. I worry about the loss of strength and mobility.  PVCs are asymptomatic   We spent more than 50% of our >25 min visit in face to face counseling regarding the above

## 2016-04-03 NOTE — Progress Notes (Signed)
[employer]  Telephone:([phone] Fax:(336) [phone]  ID: [name] OB: November 22, 1930  MR#: [id number]  [id number]  Patient Care Team: Albina Billet, MD as PCP - General (Unknown Physician Specialty) Christene Lye, MD (General Surgery)  CHIEF COMPLAINT: Stage IV prostate cancer with biopsy confirmed metastasis to lung.  INTERVAL HISTORY: Patient returns to clinic today for further evaluation and laboratory work. He recently was seen at an outside emergency room for weakness and shortness of breath. No distinct etiology was determined, but CT of the chest revealed near complete resolution of his known pulmonary nodules. Patient thinks it may have been anxiety secondary to the recent passing away of his wife. He otherwise feels well. He continues to tolerate Xtandi with only some mild fatigue. He has occasional constipation. He does not complain of hot flashes today. He has no neurologic complaints. He denies any recent fevers. He denies any chest pain or shortness of breath.  He has a good appetite and denies weight loss.  He denies any nausea, vomiting, or diarrhea. Patient offers no further specific complaints today.  REVIEW OF SYSTEMS:   Review of Systems  Constitutional: Negative for fever and malaise/fatigue.  Respiratory: Negative.  Negative for cough and shortness of breath.   Cardiovascular: Negative.  Negative for chest pain and leg swelling.  Gastrointestinal: Positive for constipation.  Genitourinary: Positive for frequency. Negative for dysuria.  Musculoskeletal: Negative.   Neurological: Negative.  Negative for sensory change and weakness.  Psychiatric/Behavioral: Negative.  The patient is not nervous/anxious.     As per HPI. Otherwise, a complete review of systems is negative.  PAST MEDICAL HISTORY: Past Medical History:  Diagnosis Date  . Atrioventricular block, complete 12/21/2011  . Cancer [facility])    prostate 2007  . Headache(784.0)    migraines w/o HA; [place]uras  . Hypertension   . Multiple lung nodules 01/2012  . Pacemaker-St.Jude 12/22/2011    PAST SURGICAL HISTORY: Past Surgical History:  Procedure Laterality Date  . CHOLECYSTECTOMY  04/04/13   open  . HERNIA REPAIR     inguinal hernia  . INSERT / REPLACE / REMOVE PACEMAKER  12/2011   St. Jude  . JOINT REPLACEMENT     both R & L replacement;  then R AKA  . LEG AMPUTATION ABOVE KNEE  2009   R leg  . PERMANENT PACEMAKER INSERTION N/A 12/21/2011   Procedure: PERMANENT PACEMAKER INSERTION;  Surgeon: Thompson Grayer, MD;  Location: [facility] CATH LAB;  Service: Cardiovascular;  Laterality: N/A;  . PROSTATECTOMY  2007    FAMILY HISTORY: Breast cancer.     ADVANCED DIRECTIVES:    HEALTH MAINTENANCE: Social History  Substance Use Topics  . Smoking status: Former Smoker    Types: Cigarettes    Quit date: 02/14/1978  . Smokeless tobacco: Former Systems developer    Quit date: 02/14/1978  . Alcohol use No     Colonoscopy:  PAP:  Bone density:  Lipid panel:  Allergies  Allergen Reactions  . Ace Inhibitors Cough  . Statins Other (See Comments)    CK elevation    Current Outpatient Prescriptions  Medication Sig Dispense Refill  . atenolol (TENORMIN) 25 MG tablet Take 25 mg by mouth daily.    Marland Kitchen atorvastatin (LIPITOR) 10 MG tablet Take 10 mg by mouth daily.     Marland Kitchen imipramine (TOFRANIL) 25 MG tablet Take 50 mg by mouth every morning.    . Multiple Vitamin (MULTIVITAMIN WITH MINERALS) TABS Take 1 tablet by mouth 2 (  two) times daily.    [name] 40 MG capsule TAKE 4 CAPSULES (160 MG TOTAL) BY MOUTH ONCE DAILY AT THE SAME TIME. MAY TAKE WITH OR WITHOUT FOOD. SWALLOW WHOLE. 120 capsule 2   No current facility-administered medications for this visit.     OBJECTIVE: Vitals:   04/04/16 1133  BP: 121/79  Pulse: 76  Resp: 18  Temp: 97.6 F (36.4 C)     Body mass index is 24.09 kg/m.    ECOG FS: 1  General: Well-developed, well-nourished, no acute distress. Sitting in a  wheelchair. Eyes: anicteric sclera. Lungs: Clear to auscultation bilaterally. Heart: Regular rate and rhythm. No rubs, murmurs, or gallops. Abdomen: Soft, nontender, nondistended. No organomegaly noted, normoactive bowel sounds. Musculoskeletal: No edema, cyanosis, or clubbing. Right BKA with prosthetic leg noted. Neuro: Alert, answering all questions appropriately. Cranial nerves grossly intact. Skin: No rashes or petechiae noted. Psych: Normal affect.    LAB RESULTS:  Lab Results  Component Value Date   NA 140 04/04/2016   K 4.7 04/04/2016   CL 107 04/04/2016   CO2 27 04/04/2016   GLUCOSE 91 04/04/2016   BUN 16 04/04/2016   CREATININE 0.79 04/04/2016   CALCIUM 9.3 04/04/2016   PROT 6.8 04/04/2016   ALBUMIN 4.0 04/04/2016   AST 24 04/04/2016   ALT 15 (L) 04/04/2016   ALKPHOS 59 04/04/2016   BILITOT 0.9 04/04/2016   GFRNONAA >60 04/04/2016   GFRAA >60 04/04/2016    Lab Results  Component Value Date   WBC 6.0 04/04/2016   NEUTROABS 4.1 04/04/2016   HGB 14.0 04/04/2016   HCT 41.0 04/04/2016   MCV 94.4 04/04/2016   PLT 193 04/04/2016   Lab Results  Component Value Date   PSA <0.01 04/04/2016     STUDIES: No results found.  ASSESSMENT: Stage IV prostate cancer with biopsy confirmed metastasis to lung.  PLAN:    1.  Stage IV prostate cancer with biopsy confirmed metastasis to lung: CT scan results from April 21, 2015 reviewed independently and reported continue positive response to therapy as demonstrated by the decrease in size of numerous pulmonary metastasis. CT results from outside emergency room revealed complete resolution, we do not have these results in hand. Patient's PSA is also undetectable at less than 0.01.  Lung nodules previously confirmed positive for prostate cancer by biopsy. Continue 160 mg oral Xtandi daily. Patient does not require any further imaging unless there is suspicion of recurrence or PSA begins to increase. Return to clinic in 4 months  with repeat laboratory work and further evaluation. Patient has indicated he is moving closer to [place] to be near family, therefore this appointment may be canceled if patient finds an oncologist closer to his new home. 2. Weakness/fatigue: Likely multifactorial, monitor. 3. Constipation: Continue OTC treatments as needed.  Approximately 30 minutes was spent in discussion of which greater than 50% was consultation.  Patient expressed understanding and was in agreement with this plan. He also understands that He can call clinic at any time with any questions, concerns, or complaints.    Lloyd Huger, MD   04/05/2016 4:33 PM

## 2016-04-04 ENCOUNTER — Encounter: Payer: Self-pay | Admitting: Oncology

## 2016-04-04 ENCOUNTER — Inpatient Hospital Stay: Payer: Medicare Other | Attending: Oncology

## 2016-04-04 ENCOUNTER — Inpatient Hospital Stay (HOSPITAL_BASED_OUTPATIENT_CLINIC_OR_DEPARTMENT_OTHER): Payer: Medicare Other | Admitting: Oncology

## 2016-04-04 VITALS — BP 121/79 | HR 76 | Temp 97.6°F | Resp 18 | Ht 74.0 in | Wt 187.6 lb

## 2016-04-04 DIAGNOSIS — Z79899 Other long term (current) drug therapy: Secondary | ICD-10-CM

## 2016-04-04 DIAGNOSIS — I10 Essential (primary) hypertension: Secondary | ICD-10-CM | POA: Insufficient documentation

## 2016-04-04 DIAGNOSIS — C7800 Secondary malignant neoplasm of unspecified lung: Secondary | ICD-10-CM | POA: Insufficient documentation

## 2016-04-04 DIAGNOSIS — R5383 Other fatigue: Secondary | ICD-10-CM

## 2016-04-04 DIAGNOSIS — R350 Frequency of micturition: Secondary | ICD-10-CM | POA: Insufficient documentation

## 2016-04-04 DIAGNOSIS — Z87891 Personal history of nicotine dependence: Secondary | ICD-10-CM

## 2016-04-04 DIAGNOSIS — K5900 Constipation, unspecified: Secondary | ICD-10-CM | POA: Diagnosis not present

## 2016-04-04 DIAGNOSIS — Z9049 Acquired absence of other specified parts of digestive tract: Secondary | ICD-10-CM | POA: Diagnosis not present

## 2016-04-04 DIAGNOSIS — C61 Malignant neoplasm of prostate: Secondary | ICD-10-CM

## 2016-04-04 DIAGNOSIS — F419 Anxiety disorder, unspecified: Secondary | ICD-10-CM

## 2016-04-04 DIAGNOSIS — I442 Atrioventricular block, complete: Secondary | ICD-10-CM

## 2016-04-04 DIAGNOSIS — Z8669 Personal history of other diseases of the nervous system and sense organs: Secondary | ICD-10-CM

## 2016-04-04 DIAGNOSIS — R300 Dysuria: Secondary | ICD-10-CM

## 2016-04-04 LAB — CBC WITH DIFFERENTIAL/PLATELET
BASOS ABS: 0.1 10*3/uL (ref 0–0.1)
BASOS PCT: 1 %
Eosinophils Absolute: 0.2 10*3/uL (ref 0–0.7)
Eosinophils Relative: 3 %
HEMATOCRIT: 41 % (ref 40.0–52.0)
Hemoglobin: 14 g/dL (ref 13.0–18.0)
Lymphocytes Relative: 19 %
Lymphs Abs: 1.1 10*3/uL (ref 1.0–3.6)
MCH: 32.3 pg (ref 26.0–34.0)
MCHC: 34.2 g/dL (ref 32.0–36.0)
MCV: 94.4 fL (ref 80.0–100.0)
MONO ABS: 0.5 10*3/uL (ref 0.2–1.0)
Monocytes Relative: 9 %
NEUTROS ABS: 4.1 10*3/uL (ref 1.4–6.5)
Neutrophils Relative %: 68 %
Platelets: 193 10*3/uL (ref 150–440)
RBC: 4.34 MIL/uL — ABNORMAL LOW (ref 4.40–5.90)
RDW: 13.9 % (ref 11.5–14.5)
WBC: 6 10*3/uL (ref 3.8–10.6)

## 2016-04-04 LAB — COMPREHENSIVE METABOLIC PANEL
ALBUMIN: 4 g/dL (ref 3.5–5.0)
ALT: 15 U/L — ABNORMAL LOW (ref 17–63)
AST: 24 U/L (ref 15–41)
Alkaline Phosphatase: 59 U/L (ref 38–126)
Anion gap: 6 (ref 5–15)
BILIRUBIN TOTAL: 0.9 mg/dL (ref 0.3–1.2)
BUN: 16 mg/dL (ref 6–20)
CHLORIDE: 107 mmol/L (ref 101–111)
CO2: 27 mmol/L (ref 22–32)
Calcium: 9.3 mg/dL (ref 8.9–10.3)
Creatinine, Ser: 0.79 mg/dL (ref 0.61–1.24)
GFR calc Af Amer: >60 mL/min (ref >60)
GFR calc non Af Amer: >60 mL/min (ref >60)
Glucose, Bld: 91 mg/dL (ref 65–99)
POTASSIUM: 4.7 mmol/L (ref 3.5–5.1)
Sodium: 140 mmol/L (ref 135–145)
TOTAL PROTEIN: 6.8 g/dL (ref 6.5–8.1)

## 2016-04-04 LAB — PSA

## 2016-04-04 NOTE — Progress Notes (Signed)
Pt went to ER on aweekend for dixxiness an grief from loosing his son to a heart attack. Got fluids and felt better, had a scan and they told him they did not see anything abnormal. He wanted finnegan to check it

## 2016-05-11 ENCOUNTER — Other Ambulatory Visit: Payer: Self-pay | Admitting: Oncology

## 2016-05-11 ENCOUNTER — Ambulatory Visit
Admission: RE | Admit: 2016-05-11 | Discharge: 2016-05-11 | Disposition: A | Discharge status: Home / Self Care | Payer: Self-pay | Source: Ambulatory Visit | Attending: Oncology | Admitting: Oncology

## 2016-05-11 DIAGNOSIS — C61 Malignant neoplasm of prostate: Secondary | ICD-10-CM

## 2016-05-30 ENCOUNTER — Other Ambulatory Visit: Payer: Self-pay

## 2016-05-30 MED ORDER — XTANDI 40 MG PO CAPS: ORAL_CAPSULE | ORAL | 2 refills | Status: DC

## 2016-06-07 ENCOUNTER — Ambulatory Visit (INDEPENDENT_AMBULATORY_CARE_PROVIDER_SITE_OTHER): Payer: Medicare Other | Admitting: *Deleted

## 2016-06-07 DIAGNOSIS — I442 Atrioventricular block, complete: Secondary | ICD-10-CM | POA: Diagnosis not present

## 2016-06-07 NOTE — Progress Notes (Signed)
Remote pacemaker transmission.   

## 2016-06-09 LAB — CUP PACEART REMOTE DEVICE CHECK
Battery Remaining Percentage: 91 %
Battery Voltage: 2.95 V
Brady Statistic AP VP Percent: 1 %
Brady Statistic AP VS Percent: 5.1 %
Brady Statistic AS VS Percent: 93 %
Brady Statistic RA Percent Paced: 3.3 %
Implantable Lead Implant Date: 20131106
Implantable Lead Location: 753860
Implantable Pulse Generator Implant Date: 20131106
Lead Channel Impedance Value: 360 Ohm
Lead Channel Pacing Threshold Amplitude: 1 V
Lead Channel Pacing Threshold Pulse Width: 0.4 ms
Lead Channel Sensing Intrinsic Amplitude: 1.5 mV
Lead Channel Setting Pacing Amplitude: 0.875
Lead Channel Setting Pacing Pulse Width: 0.4 ms
MDC IDC LEAD IMPLANT DT: 20131106
MDC IDC LEAD LOCATION: 753859
MDC IDC MSMT BATTERY REMAINING LONGEVITY: 118 mo
MDC IDC MSMT LEADCHNL RA PACING THRESHOLD PULSEWIDTH: 0.5 ms
MDC IDC MSMT LEADCHNL RV IMPEDANCE VALUE: 710 Ohm
MDC IDC MSMT LEADCHNL RV PACING THRESHOLD AMPLITUDE: 0.625 V
MDC IDC MSMT LEADCHNL RV SENSING INTR AMPL: 12 mV
MDC IDC PG SERIAL: 7412730
MDC IDC SESS DTM: 20180424070803
MDC IDC SET LEADCHNL RA PACING AMPLITUDE: 2 V
MDC IDC SET LEADCHNL RV SENSING SENSITIVITY: 1.5 mV
MDC IDC STAT BRADY AS VP PERCENT: 1 %
MDC IDC STAT BRADY RV PERCENT PACED: 1 %

## 2016-06-10 ENCOUNTER — Encounter: Payer: Self-pay | Admitting: Cardiology

## 2016-06-13 ENCOUNTER — Ambulatory Visit (INDEPENDENT_AMBULATORY_CARE_PROVIDER_SITE_OTHER): Payer: Medicare Other | Admitting: Vascular Surgery

## 2016-06-13 ENCOUNTER — Encounter (INDEPENDENT_AMBULATORY_CARE_PROVIDER_SITE_OTHER): Payer: Medicare Other

## 2016-06-17 ENCOUNTER — Other Ambulatory Visit: Payer: Self-pay | Admitting: Oncology

## 2016-07-30 NOTE — Progress Notes (Signed)
[place]  Telephone:(336) [phone] Fax:(336) [phone]  ID: Corky Mull OB: February 25, 1930  MR#: [id number]  YHC#:[id number]  Patient Care Team: Albina Billet, MD as PCP - General (Unknown Physician Specialty) Christene Lye, MD (General Surgery)  CHIEF COMPLAINT: Stage IV prostate cancer with biopsy confirmed metastasis to lung.  INTERVAL HISTORY: Patient returns to clinic today for further evaluation and laboratory work. He continues to tolerate Xtandi with only some mild fatigue. He has occasional constipation. He does not complain of hot flashes today. He has no neurologic complaints. He denies any recent fevers. He denies any chest pain or shortness of breath.  He has a good appetite and denies weight loss.  He denies any nausea, vomiting, or diarrhea. Patient offers no further specific complaints today.   REVIEW OF SYSTEMS:   Review of Systems  Constitutional: Negative for fever and malaise/fatigue.  Respiratory: Negative.  Negative for cough and shortness of breath.   Cardiovascular: Negative.  Negative for chest pain and leg swelling.  Gastrointestinal: Positive for constipation.  Genitourinary: Positive for frequency. Negative for dysuria.  Musculoskeletal: Negative.   Skin: Negative.  Negative for rash.  Neurological: Negative.  Negative for sensory change and weakness.  Psychiatric/Behavioral: Negative.  The patient is not nervous/anxious.     As per HPI. Otherwise, a complete review of systems is negative.  PAST MEDICAL HISTORY: Past Medical History:  Diagnosis Date  . Atrioventricular block, complete 12/21/2011  . Cancer [facility])    prostate 2007  . Headache(784.0)    migraines w/o HA; auras  . Hypertension   . Multiple lung nodules 01/2012  . Pacemaker-St.Jude 12/22/2011    PAST SURGICAL HISTORY: Past Surgical History:  Procedure Laterality Date  . CHOLECYSTECTOMY  04/04/13   open  . HERNIA REPAIR     inguinal hernia  . INSERT /  REPLACE / REMOVE PACEMAKER  12/2011   St. Jude  . JOINT REPLACEMENT     both R & L replacement;  then R AKA  . LEG AMPUTATION ABOVE KNEE  2009   R leg  . PERMANENT PACEMAKER INSERTION N/A 12/21/2011   Procedure: PERMANENT PACEMAKER INSERTION;  Surgeon: Thompson Grayer, MD;  Location: [facility] CATH LAB;  Service: Cardiovascular;  Laterality: N/A;  . PROSTATECTOMY  2007    FAMILY HISTORY: Breast cancer.     ADVANCED DIRECTIVES:    HEALTH MAINTENANCE: Social History  Substance Use Topics  . Smoking status: Former Smoker    Types: Cigarettes    Quit date: 02/14/1978  . Smokeless tobacco: Former Systems developer    Quit date: 02/14/1978  . Alcohol use No     Colonoscopy:  PAP:  Bone density:  Lipid panel:  Allergies  Allergen Reactions  . Ace Inhibitors Cough  . Statins Other (See Comments)    CK elevation    Current Outpatient Prescriptions  Medication Sig Dispense Refill  . atenolol (TENORMIN) 50 MG tablet Take 50 mg by mouth daily.  3  . atorvastatin (LIPITOR) 10 MG tablet Take 10 mg by mouth daily.     . clotrimazole-betamethasone (LOTRISONE) cream APPLY TO TO RASH DAILY AS NEEDED  3  . imipramine (TOFRANIL) 25 MG tablet Take 50 mg by mouth 3 (three) times daily.     . Multiple Vitamin (MULTIVITAMIN WITH MINERALS) TABS Take 1 tablet by mouth 2 (two) times daily.    [name] 40 MG capsule Take 4 capsules (160mg ) by mouth once daily at same time as directed. May take with or  without food. Swallow whole. 120 capsule 2   No current facility-administered medications for this visit.     OBJECTIVE: Vitals:   08/01/16 1010  BP: 130/75  Pulse: 65  Temp: (!) 96.9 F (36.1 C)     Body mass index is 25.08 kg/m.    ECOG FS: 1  General: Well-developed, well-nourished, no acute distress. Sitting in a wheelchair. Eyes: anicteric sclera. Lungs: Clear to auscultation bilaterally. Heart: Regular rate and rhythm. No rubs, murmurs, or gallops. Abdomen: Soft, nontender, nondistended. No organomegaly  noted, normoactive bowel sounds. Musculoskeletal: No edema, cyanosis, or clubbing. Right BKA with prosthetic leg noted. Neuro: Alert, answering all questions appropriately. Cranial nerves grossly intact. Skin: No rashes or petechiae noted. Psych: Normal affect.    LAB RESULTS:  Lab Results  Component Value Date   NA 138 08/01/2016   K 4.4 08/01/2016   CL 102 08/01/2016   CO2 28 08/01/2016   GLUCOSE 103 (H) 08/01/2016   BUN 24 (H) 08/01/2016   CREATININE 0.81 08/01/2016   CALCIUM 9.1 08/01/2016   PROT 6.6 08/01/2016   ALBUMIN 4.0 08/01/2016   AST 19 08/01/2016   ALT 13 (L) 08/01/2016   ALKPHOS 62 08/01/2016   BILITOT 0.7 08/01/2016   GFRNONAA >60 08/01/2016   GFRAA >60 08/01/2016    Lab Results  Component Value Date   WBC 6.7 08/01/2016   NEUTROABS 4.2 08/01/2016   HGB 13.6 08/01/2016   HCT 39.4 (L) 08/01/2016   MCV 95.8 08/01/2016   PLT 179 08/01/2016   Lab Results  Component Value Date   PSA <0.01 04/04/2016     STUDIES: No results found.  ASSESSMENT: Stage IV prostate cancer with biopsy confirmed metastasis to lung.  PLAN:    1.  Stage IV prostate cancer with biopsy confirmed metastasis to lung: CT scan results from April 21, 2015 reviewed independently and reported continue positive response to therapy as demonstrated by the decrease in size of numerous pulmonary metastasis. CT results from outside emergency room revealed complete resolution, we do not have these results in hand. Patient's PSA is also undetectable at less than 0.01, Today's result is pending.  Lung nodules previously confirmed positive for prostate cancer by biopsy. Continue 160 mg oral Xtandi daily. Patient does not require any further imaging unless there is suspicion of recurrence or PSA begins to increase. Return to clinic in 4 months with repeat laboratory work and further evaluation. Patient has moved to outside [place], [place] and has indicated he may try to find a local  oncologist, but for now wishes to continue to follow-up here. 2. Weakness/fatigue: Likely multifactorial, monitor. 3. Constipation: Continue OTC treatments as needed.  Approximately 30 minutes was spent in discussion of which greater than 50% was consultation.  Patient expressed understanding and was in agreement with this plan. He also understands that He can call clinic at any time with any questions, concerns, or complaints.    Lloyd Huger, MD   08/02/2016 8:30 AM

## 2016-08-01 ENCOUNTER — Inpatient Hospital Stay (HOSPITAL_BASED_OUTPATIENT_CLINIC_OR_DEPARTMENT_OTHER): Payer: Medicare Other | Admitting: Oncology

## 2016-08-01 ENCOUNTER — Inpatient Hospital Stay: Payer: Medicare Other | Attending: Oncology

## 2016-08-01 VITALS — BP 130/75 | HR 65 | Temp 96.9°F | Wt 195.4 lb

## 2016-08-01 DIAGNOSIS — Z79818 Long term (current) use of other agents affecting estrogen receptors and estrogen levels: Secondary | ICD-10-CM | POA: Diagnosis not present

## 2016-08-01 DIAGNOSIS — R350 Frequency of micturition: Secondary | ICD-10-CM | POA: Diagnosis not present

## 2016-08-01 DIAGNOSIS — R5383 Other fatigue: Secondary | ICD-10-CM

## 2016-08-01 DIAGNOSIS — I442 Atrioventricular block, complete: Secondary | ICD-10-CM | POA: Diagnosis not present

## 2016-08-01 DIAGNOSIS — R531 Weakness: Secondary | ICD-10-CM | POA: Diagnosis not present

## 2016-08-01 DIAGNOSIS — K5900 Constipation, unspecified: Secondary | ICD-10-CM | POA: Diagnosis not present

## 2016-08-01 DIAGNOSIS — Z79899 Other long term (current) drug therapy: Secondary | ICD-10-CM

## 2016-08-01 DIAGNOSIS — C7800 Secondary malignant neoplasm of unspecified lung: Secondary | ICD-10-CM | POA: Insufficient documentation

## 2016-08-01 DIAGNOSIS — Z87891 Personal history of nicotine dependence: Secondary | ICD-10-CM

## 2016-08-01 DIAGNOSIS — Z803 Family history of malignant neoplasm of breast: Secondary | ICD-10-CM | POA: Diagnosis not present

## 2016-08-01 DIAGNOSIS — C61 Malignant neoplasm of prostate: Secondary | ICD-10-CM | POA: Diagnosis not present

## 2016-08-01 DIAGNOSIS — I10 Essential (primary) hypertension: Secondary | ICD-10-CM

## 2016-08-01 LAB — CBC WITH DIFFERENTIAL/PLATELET
BASOS ABS: 0.1 10*3/uL (ref 0–0.1)
BASOS PCT: 1 %
EOS PCT: 5 %
Eosinophils Absolute: 0.4 10*3/uL (ref 0–0.7)
HCT: 39.4 % — ABNORMAL LOW (ref 40.0–52.0)
Hemoglobin: 13.6 g/dL (ref 13.0–18.0)
Lymphocytes Relative: 20 %
Lymphs Abs: 1.3 10*3/uL (ref 1.0–3.6)
MCH: 33 pg (ref 26.0–34.0)
MCHC: 34.5 g/dL (ref 32.0–36.0)
MCV: 95.8 fL (ref 80.0–100.0)
MONO ABS: 0.7 10*3/uL (ref 0.2–1.0)
Monocytes Relative: 10 %
Neutro Abs: 4.2 10*3/uL (ref 1.4–6.5)
Neutrophils Relative %: 64 %
PLATELETS: 179 10*3/uL (ref 150–440)
RBC: 4.12 MIL/uL — ABNORMAL LOW (ref 4.40–5.90)
RDW: 12.9 % (ref 11.5–14.5)
WBC: 6.7 10*3/uL (ref 3.8–10.6)

## 2016-08-01 LAB — COMPREHENSIVE METABOLIC PANEL
ALBUMIN: 4 g/dL (ref 3.5–5.0)
ALT: 13 U/L — ABNORMAL LOW (ref 17–63)
AST: 19 U/L (ref 15–41)
Alkaline Phosphatase: 62 U/L (ref 38–126)
Anion gap: 8 (ref 5–15)
BUN: 24 mg/dL — AB (ref 6–20)
CO2: 28 mmol/L (ref 22–32)
Calcium: 9.1 mg/dL (ref 8.9–10.3)
Chloride: 102 mmol/L (ref 101–111)
Creatinine, Ser: 0.81 mg/dL (ref 0.61–1.24)
GFR calc Af Amer: >60 mL/min (ref >60)
Glucose, Bld: 103 mg/dL — ABNORMAL HIGH (ref 65–99)
POTASSIUM: 4.4 mmol/L (ref 3.5–5.1)
Sodium: 138 mmol/L (ref 135–145)
Total Bilirubin: 0.7 mg/dL (ref 0.3–1.2)
Total Protein: 6.6 g/dL (ref 6.5–8.1)

## 2016-08-01 LAB — PSA: PROSTATIC SPECIFIC ANTIGEN: 1.5 ng/mL (ref 0.00–4.00)

## 2016-08-01 NOTE — Progress Notes (Signed)
Patient here today for follow up.  Patient questioning okay to have dental work done while on xtandi

## 2016-09-06 ENCOUNTER — Other Ambulatory Visit: Payer: Self-pay | Admitting: Oncology

## 2016-09-06 ENCOUNTER — Ambulatory Visit (INDEPENDENT_AMBULATORY_CARE_PROVIDER_SITE_OTHER): Payer: Medicare Other | Admitting: *Deleted

## 2016-09-06 DIAGNOSIS — I442 Atrioventricular block, complete: Secondary | ICD-10-CM

## 2016-09-07 NOTE — Progress Notes (Signed)
Remote pacemaker transmission.   

## 2016-09-08 ENCOUNTER — Encounter: Payer: Self-pay | Admitting: Cardiology

## 2016-10-06 LAB — CUP PACEART REMOTE DEVICE CHECK
Battery Remaining Longevity: 119 mo
Brady Statistic AP VP Percent: 1 %
Brady Statistic AS VP Percent: 1 %
Brady Statistic RA Percent Paced: 7.1 %
Brady Statistic RV Percent Paced: 1 %
Date Time Interrogation Session: 20180724074036
Implantable Lead Implant Date: 20131106
Implantable Lead Location: 753859
Implantable Lead Model: 1948
Lead Channel Impedance Value: 390 Ohm
Lead Channel Impedance Value: 780 Ohm
Lead Channel Pacing Threshold Pulse Width: 0.5 ms
Lead Channel Sensing Intrinsic Amplitude: 12 mV
Lead Channel Setting Pacing Amplitude: 2 V
Lead Channel Setting Pacing Pulse Width: 0.4 ms
Lead Channel Setting Sensing Sensitivity: 1.5 mV
MDC IDC LEAD IMPLANT DT: 20131106
MDC IDC LEAD LOCATION: 753860
MDC IDC MSMT BATTERY REMAINING PERCENTAGE: 91 %
MDC IDC MSMT BATTERY VOLTAGE: 2.95 V
MDC IDC MSMT LEADCHNL RA PACING THRESHOLD AMPLITUDE: 1.125 V
MDC IDC MSMT LEADCHNL RA SENSING INTR AMPL: 1.8 mV
MDC IDC MSMT LEADCHNL RV PACING THRESHOLD AMPLITUDE: 0.625 V
MDC IDC MSMT LEADCHNL RV PACING THRESHOLD PULSEWIDTH: 0.4 ms
MDC IDC PG IMPLANT DT: 20131106
MDC IDC PG SERIAL: 7412730
MDC IDC SET LEADCHNL RV PACING AMPLITUDE: 0.875
MDC IDC STAT BRADY AP VS PERCENT: 9.3 %
MDC IDC STAT BRADY AS VS PERCENT: 88 %

## 2016-11-22 ENCOUNTER — Other Ambulatory Visit: Payer: Self-pay | Admitting: *Deleted

## 2016-11-22 MED ORDER — XTANDI 40 MG PO CAPS: ORAL_CAPSULE | ORAL | 1 refills | Status: DC

## 2016-11-22 NOTE — Telephone Encounter (Signed)
Refill request from patient assistance [place] received. Patient has follow up appointment on 11/28/16

## 2016-11-27 NOTE — Progress Notes (Signed)
[place]  Telephone:(336) [phone] Fax:(336) [phone]  ID: [name] OB: [birth year]  MR#: [id number]  YOV#:[id number]  Patient Care Team: Albina Billet, MD as PCP - General (Unknown Physician Specialty) Christene Lye, MD (General Surgery)  CHIEF COMPLAINT: Stage IV prostate cancer with biopsy confirmed metastasis to lung.  INTERVAL HISTORY: Patient returns to clinic today for further evaluation and laboratory work. He continues to tolerate Xtandi with only some mild fatigue and occasional hot flashes. He also has occasional constipation. He has no neurologic complaints. He denies any recent fevers. He denies any chest pain or shortness of breath.  He has a good appetite and denies weight loss.  He denies any nausea, vomiting, or diarrhea. Patient offers no further specific complaints today.   REVIEW OF SYSTEMS:   Review of Systems  Constitutional: Negative for fever and malaise/fatigue.  Respiratory: Negative.  Negative for cough and shortness of breath.   Cardiovascular: Negative.  Negative for chest pain and leg swelling.  Gastrointestinal: Positive for constipation.  Genitourinary: Positive for frequency. Negative for dysuria.  Musculoskeletal: Negative.   Skin: Negative.  Negative for rash.  Neurological: Positive for sensory change. Negative for weakness.  Psychiatric/Behavioral: Negative.  The patient is not nervous/anxious.     As per HPI. Otherwise, a complete review of systems is negative.  PAST MEDICAL HISTORY: Past Medical History:  Diagnosis Date  . Atrioventricular block, complete 12/21/2011  . Cancer [facility])    prostate 2007  . Headache(784.0)    migraines w/o HA; auras  . Hypertension   . Multiple lung nodules 01/2012  . Pacemaker-St.Jude 12/22/2011    PAST SURGICAL HISTORY: Past Surgical History:  Procedure Laterality Date  . CHOLECYSTECTOMY  04/04/13   open  . HERNIA REPAIR     inguinal hernia  . INSERT / REPLACE / REMOVE  PACEMAKER  12/2011   St. Jude  . JOINT REPLACEMENT     both R & L replacement;  then R AKA  . LEG AMPUTATION ABOVE KNEE  2009   R leg  . PERMANENT PACEMAKER INSERTION N/A 12/21/2011   Procedure: PERMANENT PACEMAKER INSERTION;  Surgeon: Thompson Grayer, MD;  Location: [facility] CATH LAB;  Service: Cardiovascular;  Laterality: N/A;  . PROSTATECTOMY  2007    FAMILY HISTORY: Breast cancer.     ADVANCED DIRECTIVES:    HEALTH MAINTENANCE: Social History  Substance Use Topics  . Smoking status: Former Smoker    Types: Cigarettes    Quit date: 02/14/1978  . Smokeless tobacco: Former Systems developer    Quit date: 02/14/1978  . Alcohol use No     Colonoscopy:  PAP:  Bone density:  Lipid panel:  Allergies  Allergen Reactions  . Ace Inhibitors Cough  . Statins Other (See Comments)    CK elevation    Current Outpatient Prescriptions  Medication Sig Dispense Refill  . atenolol (TENORMIN) 50 MG tablet Take 50 mg by mouth daily.  3  . atorvastatin (LIPITOR) 10 MG tablet Take 10 mg by mouth daily.     . clotrimazole-betamethasone (LOTRISONE) cream APPLY TO TO RASH DAILY AS NEEDED  3  . imipramine (TOFRANIL) 25 MG tablet Take 50 mg by mouth 3 (three) times daily.     . Multiple Vitamin (MULTIVITAMIN WITH MINERALS) TABS Take 1 tablet by mouth 2 (two) times daily.    [name] 40 MG capsule Take 4 capsules (160mg ) by mouth once daily at same time as directed. May take with or without food. Swallow whole.  120 capsule 1   No current facility-administered medications for this visit.     OBJECTIVE: Vitals:   11/28/16 1056  BP: (!) 146/80  Pulse: 69  Resp: 18  Temp: 97.8 F (36.6 C)     Body mass index is 25.16 kg/m.    ECOG FS: 1  General: Well-developed, well-nourished, no acute distress. Sitting in a wheelchair. Eyes: anicteric sclera. Lungs: Clear to auscultation bilaterally. Heart: Regular rate and rhythm. No rubs, murmurs, or gallops. Abdomen: Soft, nontender, nondistended. No organomegaly  noted, normoactive bowel sounds. Musculoskeletal: No edema, cyanosis, or clubbing. Right BKA with prosthetic leg noted. Neuro: Alert, answering all questions appropriately. Cranial nerves grossly intact. Skin: No rashes or petechiae noted. Psych: Normal affect.    LAB RESULTS:  Lab Results  Component Value Date   NA 140 11/28/2016   K 4.4 11/28/2016   CL 104 11/28/2016   CO2 25 11/28/2016   GLUCOSE 85 11/28/2016   BUN 18 11/28/2016   CREATININE 0.82 11/28/2016   CALCIUM 9.4 11/28/2016   PROT 6.8 11/28/2016   ALBUMIN 4.2 11/28/2016   AST 21 11/28/2016   ALT 14 (L) 11/28/2016   ALKPHOS 58 11/28/2016   BILITOT 0.8 11/28/2016   GFRNONAA >60 11/28/2016   GFRAA >60 11/28/2016    Lab Results  Component Value Date   WBC 6.7 11/28/2016   NEUTROABS 4.3 11/28/2016   HGB 14.6 11/28/2016   HCT 43.5 11/28/2016   MCV 97.4 11/28/2016   PLT 178 11/28/2016   Lab Results  Component Value Date   PSA <0.01 04/04/2016     STUDIES: No results found.  ASSESSMENT: Stage IV prostate cancer with biopsy confirmed metastasis to lung.  PLAN:    1.  Stage IV prostate cancer with biopsy confirmed metastasis to lung: CT scan results from April 21, 2015 reviewed independently and reported continue positive response to therapy as demonstrated by the decrease in size of numerous pulmonary metastasis. CT results from outside emergency room revealed complete resolution, we do not have these results in hand. Patient's PSA is also undetectable at less than 0.01, today's result is pending.  Lung nodules previously confirmed positive for prostate cancer by biopsy. Continue 160 mg oral Xtandi daily. Patient does not require any further imaging unless there is suspicion of recurrence or PSA begins to increase. Return to clinic in 4 months with repeat laboratory work and further evaluation. Patient has moved to outside [place], [place] but states he will keep all of his medical care at [facility]. 2.  Weakness/fatigue: Likely multifactorial, monitor. 3. Constipation: Continue OTC treatments as needed. 4. Hot flashes: Secondary to Xtandi.  Approximately 30 minutes was spent in discussion of which greater than 50% was consultation.  Patient expressed understanding and was in agreement with this plan. He also understands that He can call clinic at any time with any questions, concerns, or complaints.    Lloyd Huger, MD   11/28/2016 11:14 AM

## 2016-11-28 ENCOUNTER — Inpatient Hospital Stay: Payer: Medicare Other | Attending: Oncology

## 2016-11-28 ENCOUNTER — Inpatient Hospital Stay (HOSPITAL_BASED_OUTPATIENT_CLINIC_OR_DEPARTMENT_OTHER): Payer: Medicare Other | Admitting: Oncology

## 2016-11-28 VITALS — BP 146/80 | HR 69 | Temp 97.8°F | Resp 18 | Wt 196.0 lb

## 2016-11-28 DIAGNOSIS — K5900 Constipation, unspecified: Secondary | ICD-10-CM

## 2016-11-28 DIAGNOSIS — R350 Frequency of micturition: Secondary | ICD-10-CM | POA: Diagnosis not present

## 2016-11-28 DIAGNOSIS — Z79899 Other long term (current) drug therapy: Secondary | ICD-10-CM | POA: Insufficient documentation

## 2016-11-28 DIAGNOSIS — I442 Atrioventricular block, complete: Secondary | ICD-10-CM | POA: Insufficient documentation

## 2016-11-28 DIAGNOSIS — R232 Flushing: Secondary | ICD-10-CM

## 2016-11-28 DIAGNOSIS — I10 Essential (primary) hypertension: Secondary | ICD-10-CM | POA: Diagnosis not present

## 2016-11-28 DIAGNOSIS — R5383 Other fatigue: Secondary | ICD-10-CM

## 2016-11-28 DIAGNOSIS — C7800 Secondary malignant neoplasm of unspecified lung: Secondary | ICD-10-CM | POA: Diagnosis not present

## 2016-11-28 DIAGNOSIS — G43909 Migraine, unspecified, not intractable, without status migrainosus: Secondary | ICD-10-CM

## 2016-11-28 DIAGNOSIS — Z9049 Acquired absence of other specified parts of digestive tract: Secondary | ICD-10-CM | POA: Insufficient documentation

## 2016-11-28 DIAGNOSIS — C61 Malignant neoplasm of prostate: Secondary | ICD-10-CM | POA: Insufficient documentation

## 2016-11-28 DIAGNOSIS — Z87891 Personal history of nicotine dependence: Secondary | ICD-10-CM | POA: Insufficient documentation

## 2016-11-28 DIAGNOSIS — R531 Weakness: Secondary | ICD-10-CM | POA: Diagnosis not present

## 2016-11-28 LAB — COMPREHENSIVE METABOLIC PANEL
ALT: 14 U/L — AB (ref 17–63)
ANION GAP: 11 (ref 5–15)
AST: 21 U/L (ref 15–41)
Albumin: 4.2 g/dL (ref 3.5–5.0)
Alkaline Phosphatase: 58 U/L (ref 38–126)
BUN: 18 mg/dL (ref 6–20)
CALCIUM: 9.4 mg/dL (ref 8.9–10.3)
CHLORIDE: 104 mmol/L (ref 101–111)
CO2: 25 mmol/L (ref 22–32)
CREATININE: 0.82 mg/dL (ref 0.61–1.24)
Glucose, Bld: 85 mg/dL (ref 65–99)
Potassium: 4.4 mmol/L (ref 3.5–5.1)
SODIUM: 140 mmol/L (ref 135–145)
Total Bilirubin: 0.8 mg/dL (ref 0.3–1.2)
Total Protein: 6.8 g/dL (ref 6.5–8.1)

## 2016-11-28 LAB — CBC WITH DIFFERENTIAL/PLATELET
BASOS PCT: 1 %
Basophils Absolute: 0.1 10*3/uL (ref 0–0.1)
EOS ABS: 0.2 10*3/uL (ref 0–0.7)
Eosinophils Relative: 3 %
HEMATOCRIT: 43.5 % (ref 40.0–52.0)
HEMOGLOBIN: 14.6 g/dL (ref 13.0–18.0)
LYMPHS ABS: 1.4 10*3/uL (ref 1.0–3.6)
Lymphocytes Relative: 21 %
MCH: 32.6 pg (ref 26.0–34.0)
MCHC: 33.5 g/dL (ref 32.0–36.0)
MCV: 97.4 fL (ref 80.0–100.0)
MONOS PCT: 12 %
Monocytes Absolute: 0.8 10*3/uL (ref 0.2–1.0)
NEUTROS ABS: 4.3 10*3/uL (ref 1.4–6.5)
NEUTROS PCT: 63 %
Platelets: 178 10*3/uL (ref 150–440)
RBC: 4.47 MIL/uL (ref 4.40–5.90)
RDW: 13.3 % (ref 11.5–14.5)
WBC: 6.7 10*3/uL (ref 3.8–10.6)

## 2016-11-28 LAB — PSA

## 2016-11-28 NOTE — Progress Notes (Signed)
Patient here today for follow up regarding prostate cancer. Patient reports constipation with Xtandi, discussed using stool softener daily and miralax.

## 2016-12-06 ENCOUNTER — Encounter: Payer: Medicare Other | Admitting: *Deleted

## 2016-12-08 ENCOUNTER — Encounter: Payer: Self-pay | Admitting: Cardiology

## 2017-01-24 ENCOUNTER — Other Ambulatory Visit: Payer: Self-pay | Admitting: Pharmacist

## 2017-01-24 ENCOUNTER — Telehealth: Payer: Self-pay | Admitting: Oncology

## 2017-01-24 DIAGNOSIS — C61 Malignant neoplasm of prostate: Secondary | ICD-10-CM

## 2017-01-24 MED ORDER — XTANDI 40 MG PO CAPS: ORAL_CAPSULE | ORAL | 3 refills | Status: DC

## 2017-01-24 NOTE — Telephone Encounter (Signed)
Oral Oncology Patient Advocate Encounter Re-enrollment  Left patient a message to call me, so we can get his application sent out for 2019.  Met patient in lobby complete application for Xtandi in an effort to reduce patient's out of pocket expense for Xtandi to $0.    Application completed and faxed to [phone].   Patient assistance phone number for follow up is [phone].   This encounter will be updated until final determination.   [place] Patient Advocate [phone] 01/24/2017 4:06 PM

## 2017-01-24 NOTE — Progress Notes (Signed)
Oral Chemotherapy Pharmacist Encounter  Received a call from [place] the pharmacy that handles [facility] manufacturer patient assistance. They were requesting a refill for Mr. Anil Havard. Refill was e-scribed to the pharmacy  Darl Pikes, PharmD, BCPS Hematology/Oncology Clinical Pharmacist [facility] [phone]  01/24/2017 3:27 PM

## 2017-02-20 ENCOUNTER — Telehealth: Payer: Self-pay | Admitting: Oncology

## 2017-02-20 ENCOUNTER — Telehealth: Payer: Self-pay | Admitting: Pharmacist

## 2017-02-20 DIAGNOSIS — C61 Malignant neoplasm of prostate: Secondary | ICD-10-CM

## 2017-02-20 MED ORDER — XTANDI 40 MG PO CAPS: ORAL_CAPSULE | ORAL | 3 refills | Status: DC

## 2017-02-20 MED FILL — XTANDI 40 MG CAPSULE: 40 | 30 days supply | Qty: 120 | Fill #0

## 2017-02-20 NOTE — Telephone Encounter (Signed)
Oral Oncology Pharmacist Encounter  Received refill prescription for Xtandi (enzalutamide)  for the treatment of metastatic prostate cancer, planned duration until disease progression or unacceptable drug toxicity. Medication started 05/2014.  Labs from 11/28/16 assessed, no relevant lab abnormalities. Prescription dose and frequency assessed.   Current medication list in Epic reviewed, no relevant DDIs with Xtandi identified.  Prescription has been e-scribed to the [facility] and we will see if the insurance company will let it be filled there.  Oral Oncology Clinic will continue to follow patient.   Darl Pikes, PharmD, BCPS Hematology/Oncology Clinical Pharmacist [facility] [phone]  02/20/2017 12:10 PM

## 2017-02-20 NOTE — Telephone Encounter (Signed)
Oral Oncology Patient Advocate Encounter  Was successful in securing patient an $ 6500.00 grant from [employer] (PAF) to provide copayment coverage for his Xtandi.  This will keep the out of pocket expense at $0.    I have spoken with the patient.    The billing information is as follows and has been shared with [place].    Co-pay $2434.10  Member ID: [id number] Group ID: [id number] RxBin: [id number] Dates of Eligibility: 08/20/2016 through 0104/2020   [facility] Patient Advocate [phone] 02/20/2017 12:35 PM

## 2017-02-20 NOTE — Telephone Encounter (Signed)
Oral Oncology Patient Advocate Encounter  E-mailed [facility] to mail [name] to patient.    [place] Patient Advocate [phone] 02/20/2017 4:19 PM

## 2017-02-20 NOTE — Telephone Encounter (Signed)
Oral Chemotherapy Pharmacist Encounter  Foundation assistance opened up to cover the copay of Mr. [name]. A prescription for his [name] will now be sent to [place].  Darl Pikes, PharmD, BCPS Hematology/Oncology Clinical Pharmacist [facility] [phone]  02/20/2017 12:01 PM

## 2017-02-21 NOTE — Telephone Encounter (Signed)
Oral Oncology Patient Advocate Encounter  Was successful in securing patient a yearly grant from [employer] to provide copayment coverage for his Xtandi.  This will keep the out of pocket expense at $0.    I have spoken with the patient.    The billing information is as follows and has been shared with [place].   Member ID: [id number] Group ID: [id number] RxBin: [id number] PCN: [id number] Dates of Eligibility: 02/21/2017 through 02/21/2018   [place] Patient Advocate [phone] 02/21/2017 10:06 AM

## 2017-02-21 NOTE — Telephone Encounter (Signed)
Oral Chemotherapy Pharmacist Encounter  I spoke with patient for overview of refill prescription for Xtandi (enzalutamide)for the treatment of metastatic prostate cancer. Medication started 05/2014.  Pt was previously filling at an outside pharmacy but the medication is now being filled at [facility]  Reviewed administration, dosing, side effects, monitoring, drug-food interactions, safe handling, storage, and disposal. Patient will take 4 capsules (160mg ) by mouth once daily at same time. May take with or without food.  He reports not significant side effects since he was last seen in clinic.     Mr. Cheek voiced understanding and appreciation. All questions answered. Patient knows to call the office with questions or concerns. Oral Oncology Clinic will continue to follow.  Thank you,  Darl Pikes, PharmD, BCPS Hematology/Oncology Clinical Pharmacist [facility] [phone]  02/21/2017 11:46 AM

## 2017-03-01 NOTE — Telephone Encounter (Addendum)
Oral Oncology Patient Advocate Encounter  Received notification from [facility] Patient Assistance program that patient has been successfully enrolled into their program to receive Xtandi from the manufacturer at $0 out of pocket until 02/13/2018.   I called and spoke with patient.  He knows we will have to re-apply.   Patient knows to call the office with questions or concerns.  Oral Oncology Clinic will continue to follow.  [place] Patient Advocate [phone] 03/01/2017 1:37 PM    Called [facility] to let them know we had gotten grant fund for patient and would they put it on hold until he needs it. Per Beth P. She will put on hold. We just need to call when he needs it.  [place] Patient Advocate [phone] 03/02/2017 8:17 AM

## 2017-03-04 IMAGING — NM NUCLEAR MEDICINE WHOLE BODY BONE SCINTIGRAPHY
2 series · 10 of 10 positions shown · non-contrast
Comparison: 03/14/2012.

CLINICAL DATA: 83-year-old male with history of prostate cancer
with increasing PSA. Prior left knee replacement and above the knee
right leg amputation. Initial encounter.

EXAM:
NUCLEAR MEDICINE WHOLE BODY BONE SCAN
TECHNIQUE: Whole body anterior and posterior images were obtained approximately
3 hours after intravenous injection of radiopharmaceutical.
RADIOPHARMACEUTICALS:  23.62 mCi Nechnetium-66 MDP

[Series 1000: statics · 2.40mm/px · 4 acquisitions, 8 frames shown]
[im 1/4]
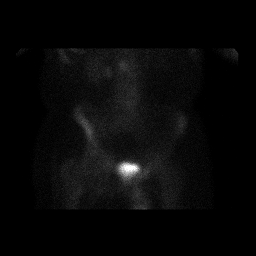
[im 1/4]
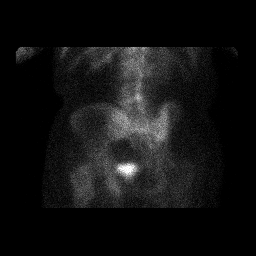
[im 2/4]
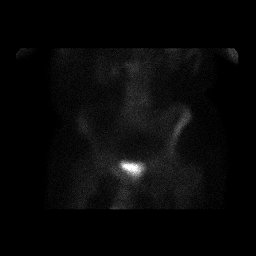
[im 2/4]
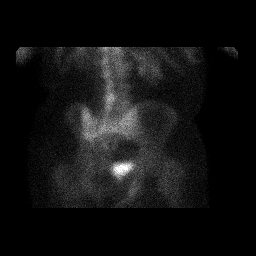
[im 3/4]
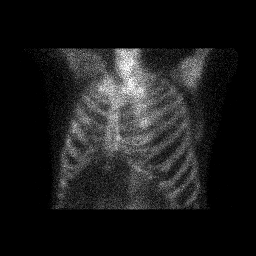
[im 3/4]
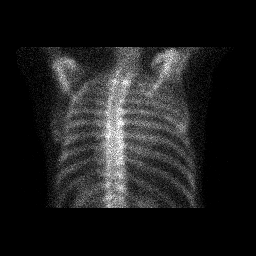
[im 4/4]
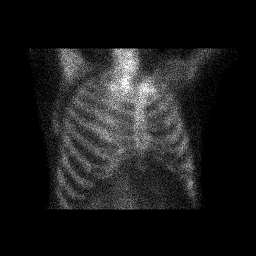
[im 4/4]
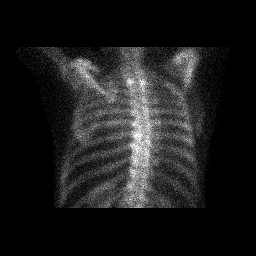

[Series 1000: 3 hr wholebody · 2.40mm/px · 2 of 2 frames shown]
[frame 1/2]
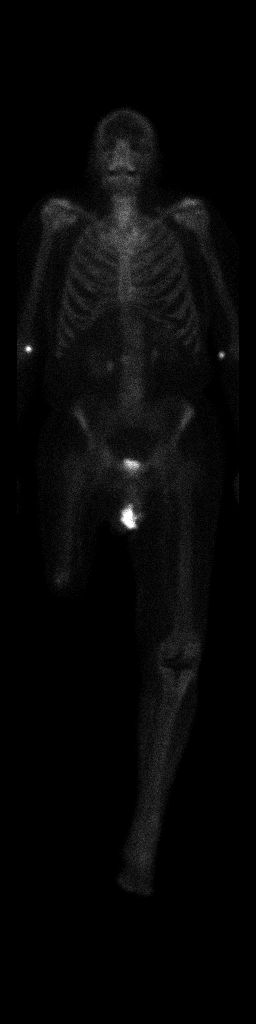
[frame 2/2]
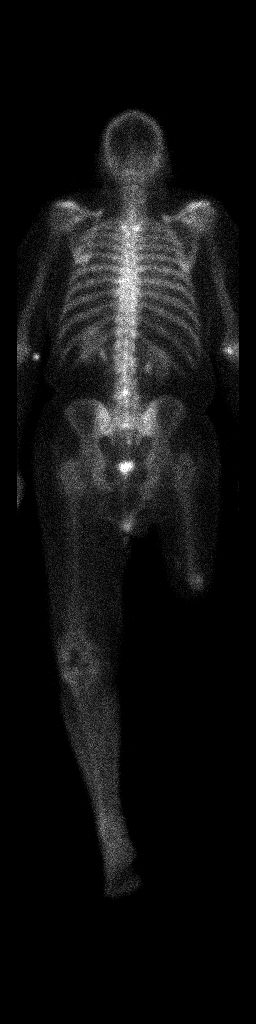

[10 of 10 positions shown; findings below may reference images not displayed]

FINDINGS: Radiotracer uptake surrounding the shoulders, sternoclavicular joint
and right L1-2 level similar to prior exam and most suggestive of
result of degenerative changes.

Slight increased uptake lower sternal costal junction greater on
left unchanged.

Subtle increased uptake at the level of the right femoral amputation
site stable.

Minimally increased radiotracer uptake surrounding the left total
knee replacement is stable.

Focal increased uptake L4 posterior elements appears minimally more
conspicuous than on the prior examination. This may be related to
degenerative changes although subtle metastatic disease not entirely
excluded.

Both kidneys visualized.
IMPRESSION: Focal increased uptake L4 posterior elements appears minimally more
conspicuous than on the prior examination. This may be related to
degenerative changes although subtle metastatic disease not entirely
excluded.

Otherwise no evidence of osseous metastatic disease as discussed
above.

## 2017-03-06 ENCOUNTER — Telehealth: Payer: Self-pay | Admitting: Oncology

## 2017-03-06 NOTE — Telephone Encounter (Signed)
Oral Oncology Patient Advocate Encounter  [employer] diagnosis verification form was completed and faxed to [employer] at [phone]  This is the last step from the office needed to secure continued patient access to funding.   [place] Patient Advocate [phone] 03/06/2017 10:01 AM

## 2017-03-28 MED FILL — XTANDI 40 MG CAPSULE: 40 | 30 days supply | Qty: 120 | Fill #1

## 2017-03-31 ENCOUNTER — Other Ambulatory Visit: Payer: Self-pay | Admitting: *Deleted

## 2017-03-31 DIAGNOSIS — C61 Malignant neoplasm of prostate: Secondary | ICD-10-CM

## 2017-04-02 NOTE — Progress Notes (Signed)
[name]  Telephone:(336) [phone] Fax:(336) [phone]  ID: [name] OB: 1930-08-13  MR#: [id number]  QMG#:[id number]  Patient Care Team: Albina Billet, MD as PCP - General (Unknown Physician Specialty) Christene Lye, MD (General Surgery)  CHIEF COMPLAINT: Stage IV prostate cancer with biopsy confirmed metastasis to lung.  INTERVAL HISTORY: Patient returns to clinic today for routine 9-month evaluation and laboratory work. He continues to tolerate Xtandi with only some mild fatigue and occasional hot flashes. He has no neurologic complaints. He denies any recent fevers. He denies any chest pain or shortness of breath.  He has a good appetite and denies weight loss.  He denies any nausea, vomiting, constipation, or diarrhea. Patient offers no further specific complaints today.   REVIEW OF SYSTEMS:   Review of Systems  Constitutional: Negative for fever and malaise/fatigue.  Respiratory: Negative.  Negative for cough and shortness of breath.   Cardiovascular: Negative.  Negative for chest pain and leg swelling.  Gastrointestinal: Negative.  Negative for abdominal pain and constipation.  Genitourinary: Positive for frequency. Negative for dysuria.  Musculoskeletal: Negative.   Skin: Negative.  Negative for rash.  Neurological: Positive for sensory change. Negative for weakness.  Psychiatric/Behavioral: Negative.  The patient is not nervous/anxious.     As per HPI. Otherwise, a complete review of systems is negative.  PAST MEDICAL HISTORY: Past Medical History:  Diagnosis Date  . Atrioventricular block, complete 12/21/2011  . Cancer [facility])    prostate 2007  . Headache(784.0)    migraines w/o HA; [place]  . Hypertension   . Multiple lung nodules 01/2012  . Pacemaker-St.Jude 12/22/2011    PAST SURGICAL HISTORY: Past Surgical History:  Procedure Laterality Date  . CHOLECYSTECTOMY  04/04/13   open  . HERNIA REPAIR     inguinal hernia  . INSERT /  REPLACE / REMOVE PACEMAKER  12/2011   St. Jude  . JOINT REPLACEMENT     both R & L replacement;  then R AKA  . LEG AMPUTATION ABOVE KNEE  2009   R leg  . PERMANENT PACEMAKER INSERTION N/A 12/21/2011   Procedure: PERMANENT PACEMAKER INSERTION;  Surgeon: Thompson Grayer, MD;  Location: [facility] CATH LAB;  Service: Cardiovascular;  Laterality: N/A;  . PROSTATECTOMY  2007    FAMILY HISTORY: Breast cancer.     ADVANCED DIRECTIVES:    HEALTH MAINTENANCE: Social History   Tobacco Use  . Smoking status: Former Smoker    Types: Cigarettes    Last attempt to quit: 02/14/1978    Years since quitting: 39.1  . Smokeless tobacco: Former Systems developer    Quit date: 02/14/1978  Substance Use Topics  . Alcohol use: No  . Drug use: No     Colonoscopy:  PAP:  Bone density:  Lipid panel:  Allergies  Allergen Reactions  . Ace Inhibitors Cough  . Statins Other (See Comments)    CK elevation    Current Outpatient Medications  Medication Sig Dispense Refill  . atenolol (TENORMIN) 50 MG tablet Take 50 mg by mouth daily.  3  . atorvastatin (LIPITOR) 10 MG tablet Take 10 mg by mouth daily.     . clotrimazole-betamethasone (LOTRISONE) cream APPLY TO TO RASH DAILY AS NEEDED  3  . imipramine (TOFRANIL) 25 MG tablet Take 50 mg by mouth 3 (three) times daily.     . Multiple Vitamin (MULTIVITAMIN WITH MINERALS) TABS Take 1 tablet by mouth 2 (two) times daily.    [name] 40 MG capsule Take  4 capsules (160mg ) by mouth once daily at same time. May take with or without food. 120 capsule 3   No current facility-administered medications for this visit.     OBJECTIVE: Vitals:   04/03/17 1335  BP: (!) 146/83  Pulse: 67  Resp: 18  Temp: 97.8 F (36.6 C)     Body mass index is 25.69 kg/m.    ECOG FS: 0  General: Well-developed, well-nourished, no acute distress. Sitting in a wheelchair. Eyes: anicteric sclera. Lungs: Clear to auscultation bilaterally. Heart: Regular rate and rhythm. No rubs, murmurs, or  gallops. Abdomen: Soft, nontender, nondistended. No organomegaly noted, normoactive bowel sounds. Musculoskeletal: No edema, cyanosis, or clubbing. Right BKA with prosthetic leg noted. Neuro: Alert, answering all questions appropriately. Cranial nerves grossly intact. Skin: No rashes or petechiae noted. Psych: Normal affect.    LAB RESULTS:  Lab Results  Component Value Date   NA 138 04/03/2017   K 4.5 04/03/2017   CL 103 04/03/2017   CO2 27 04/03/2017   GLUCOSE 93 04/03/2017   BUN 22 (H) 04/03/2017   CREATININE 0.98 04/03/2017   CALCIUM 9.5 04/03/2017   PROT 6.8 04/03/2017   ALBUMIN 4.2 04/03/2017   AST 21 04/03/2017   ALT 14 (L) 04/03/2017   ALKPHOS 60 04/03/2017   BILITOT 0.7 04/03/2017   GFRNONAA >60 04/03/2017   GFRAA >60 04/03/2017    Lab Results  Component Value Date   WBC 6.9 04/03/2017   NEUTROABS 4.3 04/03/2017   HGB 14.3 04/03/2017   HCT 42.1 04/03/2017   MCV 97.0 04/03/2017   PLT 178 04/03/2017   Lab Results  Component Value Date   PSA <0.01 04/04/2016     STUDIES: No results found.  ASSESSMENT: Stage IV prostate cancer with biopsy confirmed metastasis to lung.  PLAN:    1.  Stage IV prostate cancer with biopsy confirmed metastasis to lung: CT scan results from April 21, 2015 reviewed independently and reported continue positive response to therapy as demonstrated by the decrease in size of numerous pulmonary metastasis. CT results from outside emergency room revealed complete resolution, we do not have these results in hand. Patient's PSA continues to be undetectable at less than 0.01, today's result is pending.  Lung nodules previously confirmed positive for prostate cancer by biopsy. Continue 160 mg oral Xtandi daily. Patient does not require any further imaging unless there is suspicion of recurrence or PSA begins to increase. Return to clinic in 4 months with repeat laboratory work and further evaluation. Patient has moved to outside [place],  [place] but states he will keep all of his medical care at [facility]. 2. Weakness/fatigue: Likely multifactorial, monitor. 3. Constipation: Patient does not complain of this today.  Continue OTC treatments as needed. 4. Hot flashes: Secondary to Xtandi.  Approximately 30 minutes was spent in discussion of which greater than 50% was consultation.  Patient expressed understanding and was in agreement with this plan. He also understands that He can call clinic at any time with any questions, concerns, or complaints.    Lloyd Huger, MD   04/03/2017 2:03 PM

## 2017-04-03 ENCOUNTER — Inpatient Hospital Stay: Payer: Medicare Other

## 2017-04-03 ENCOUNTER — Inpatient Hospital Stay: Payer: Medicare Other | Attending: Oncology | Admitting: Oncology

## 2017-04-03 VITALS — BP 146/83 | HR 67 | Temp 97.8°F | Resp 18 | Wt 200.1 lb

## 2017-04-03 DIAGNOSIS — C61 Malignant neoplasm of prostate: Secondary | ICD-10-CM | POA: Insufficient documentation

## 2017-04-03 DIAGNOSIS — I442 Atrioventricular block, complete: Secondary | ICD-10-CM | POA: Insufficient documentation

## 2017-04-03 DIAGNOSIS — I10 Essential (primary) hypertension: Secondary | ICD-10-CM | POA: Insufficient documentation

## 2017-04-03 DIAGNOSIS — R531 Weakness: Secondary | ICD-10-CM | POA: Diagnosis not present

## 2017-04-03 DIAGNOSIS — Z9049 Acquired absence of other specified parts of digestive tract: Secondary | ICD-10-CM | POA: Insufficient documentation

## 2017-04-03 DIAGNOSIS — C7800 Secondary malignant neoplasm of unspecified lung: Secondary | ICD-10-CM | POA: Diagnosis not present

## 2017-04-03 DIAGNOSIS — Z79899 Other long term (current) drug therapy: Secondary | ICD-10-CM | POA: Diagnosis not present

## 2017-04-03 DIAGNOSIS — Z87891 Personal history of nicotine dependence: Secondary | ICD-10-CM | POA: Diagnosis not present

## 2017-04-03 DIAGNOSIS — R232 Flushing: Secondary | ICD-10-CM | POA: Diagnosis not present

## 2017-04-03 DIAGNOSIS — R5383 Other fatigue: Secondary | ICD-10-CM | POA: Diagnosis not present

## 2017-04-03 LAB — COMPREHENSIVE METABOLIC PANEL
ALT: 14 U/L — ABNORMAL LOW (ref 17–63)
AST: 21 U/L (ref 15–41)
Albumin: 4.2 g/dL (ref 3.5–5.0)
Alkaline Phosphatase: 60 U/L (ref 38–126)
Anion gap: 8 (ref 5–15)
BUN: 22 mg/dL — ABNORMAL HIGH (ref 6–20)
CHLORIDE: 103 mmol/L (ref 101–111)
CO2: 27 mmol/L (ref 22–32)
Calcium: 9.5 mg/dL (ref 8.9–10.3)
Creatinine, Ser: 0.98 mg/dL (ref 0.61–1.24)
GFR calc non Af Amer: >60 mL/min (ref >60)
Glucose, Bld: 93 mg/dL (ref 65–99)
POTASSIUM: 4.5 mmol/L (ref 3.5–5.1)
Sodium: 138 mmol/L (ref 135–145)
Total Bilirubin: 0.7 mg/dL (ref 0.3–1.2)
Total Protein: 6.8 g/dL (ref 6.5–8.1)

## 2017-04-03 LAB — CBC WITH DIFFERENTIAL/PLATELET
Basophils Absolute: 0.1 10*3/uL (ref 0–0.1)
Basophils Relative: 1 %
Eosinophils Absolute: 0.2 10*3/uL (ref 0–0.7)
Eosinophils Relative: 3 %
HCT: 42.1 % (ref 40.0–52.0)
Hemoglobin: 14.3 g/dL (ref 13.0–18.0)
Lymphocytes Relative: 24 %
Lymphs Abs: 1.7 10*3/uL (ref 1.0–3.6)
MCH: 32.9 pg (ref 26.0–34.0)
MCHC: 33.9 g/dL (ref 32.0–36.0)
MCV: 97 fL (ref 80.0–100.0)
Monocytes Absolute: 0.7 10*3/uL (ref 0.2–1.0)
Monocytes Relative: 10 %
Neutro Abs: 4.3 10*3/uL (ref 1.4–6.5)
Neutrophils Relative %: 62 %
Platelets: 178 10*3/uL (ref 150–440)
RBC: 4.34 MIL/uL — ABNORMAL LOW (ref 4.40–5.90)
RDW: 12.8 % (ref 11.5–14.5)
WBC: 6.9 10*3/uL (ref 3.8–10.6)

## 2017-04-03 LAB — PSA: Prostatic Specific Antigen: <0.01 ng/mL (ref 0.00–4.00)

## 2017-04-03 NOTE — Progress Notes (Signed)
Pt in for follow up, denies any difficulties or concerns at this time.

## 2017-04-26 MED FILL — XTANDI 40 MG CAPSULE: 40 | 30 days supply | Qty: 120 | Fill #2

## 2017-04-27 ENCOUNTER — Ambulatory Visit: Payer: Medicare Other | Admitting: *Deleted

## 2017-04-27 DIAGNOSIS — I442 Atrioventricular block, complete: Secondary | ICD-10-CM

## 2017-04-27 NOTE — Progress Notes (Signed)
Remote pacemaker transmission.   

## 2017-04-28 ENCOUNTER — Encounter: Payer: Self-pay | Admitting: Cardiology

## 2017-05-10 ENCOUNTER — Telehealth: Payer: Self-pay | Admitting: Pharmacist

## 2017-05-10 NOTE — Telephone Encounter (Signed)
Oral Chemotherapy Pharmacist Encounter  Follow-Up Form  Called patient today to follow up regarding patient's oral chemotherapy medication: Xtandi (enzalutamide)  Original Start date of oral chemotherapy: 05/2014  Pt reports 2 doses of Xtandi missed recently due to a GI bug both he and is wife had. He was not able to keep anything down. He is feeling better now.    Pt reports the following side effects: constipation, he stated he has had some constipation recently for which he had used EX-Lax when needed. We discussed using Miralax daily as a way to stay more regular.  Recent labs reviewed: PSA from 04/03/17  New medications?: none, reported  Other Issues: none, reported  Patient knows to call the office with questions or concerns. Oral Oncology Clinic will continue to follow.  Darl Pikes, PharmD, BCPS Hematology/Oncology Clinical Pharmacist [facility] [place] [facility] [phone]  05/10/2017 2:40 PM

## 2017-05-13 NOTE — Progress Notes (Signed)
Unable to process transmission. Patient released in [facility] monitoring site 05/05/17. Charges deleted.

## 2017-05-22 MED FILL — XTANDI 40 MG CAPSULE: 40 | 30 days supply | Qty: 120 | Fill #3

## 2017-06-21 ENCOUNTER — Other Ambulatory Visit: Payer: Self-pay | Admitting: Oncology

## 2017-06-21 DIAGNOSIS — C61 Malignant neoplasm of prostate: Secondary | ICD-10-CM

## 2017-06-23 MED FILL — XTANDI 40 MG CAPSULE: 40 | 30 days supply | Qty: 120 | Fill #0

## 2017-07-18 MED FILL — XTANDI 40 MG CAPSULE: 40 | 30 days supply | Qty: 120 | Fill #1

## 2017-07-27 ENCOUNTER — Encounter: Payer: Medicare Other | Admitting: *Deleted

## 2017-07-30 NOTE — Progress Notes (Signed)
[place]  Telephone:(336) [phone] Fax:(336) [phone]  ID: Corky Mull OB: [birth year]  MR#: [id number]  CBJ#:[id number]  Patient Care Team: Albina Billet, MD as PCP - General (Unknown Physician Specialty) Christene Lye, MD (General Surgery)  CHIEF COMPLAINT: Stage IV prostate cancer with biopsy confirmed metastasis to lung.  INTERVAL HISTORY: Patient returns to clinic today for repeat laboratory work and routine 66-month evaluation.  He continues to tolerate Xtandi well without significant side effects. He has no neurologic complaints, but his memory seems to be worsening.  He denies any recent fevers or illnesses.  He denies any chest pain or shortness of breath.  He has a good appetite and denies weight loss.  He denies any nausea, vomiting, constipation, or diarrhea.  Patient offers no specific complaints today.  REVIEW OF SYSTEMS:   Review of Systems  Constitutional: Negative.  Negative for fever, malaise/fatigue and weight loss.  Respiratory: Negative.  Negative for cough and shortness of breath.   Cardiovascular: Negative.  Negative for chest pain and leg swelling.  Gastrointestinal: Negative.  Negative for abdominal pain and constipation.  Genitourinary: Negative.  Negative for dysuria and frequency.  Musculoskeletal: Negative.  Negative for back pain.  Skin: Negative.  Negative for rash.  Neurological: Negative.  Negative for sensory change, focal weakness and weakness.  Psychiatric/Behavioral: Positive for memory loss. The patient is not nervous/anxious.     As per HPI. Otherwise, a complete review of systems is negative.  PAST MEDICAL HISTORY: Past Medical History:  Diagnosis Date  . Atrioventricular block, complete 12/21/2011  . Cancer [facility])    prostate 2007  . Headache(784.0)    migraines w/o HA; [place]  . Hypertension   . Multiple lung nodules 01/2012  . Pacemaker-St.Jude 12/22/2011    PAST SURGICAL HISTORY: Past Surgical History:    Procedure Laterality Date  . CHOLECYSTECTOMY  04/04/13   open  . HERNIA REPAIR     inguinal hernia  . INSERT / REPLACE / REMOVE PACEMAKER  12/2011   St. Jude  . JOINT REPLACEMENT     both R & L replacement;  then R AKA  . LEG AMPUTATION ABOVE KNEE  2009   R leg  . PERMANENT PACEMAKER INSERTION N/A 12/21/2011   Procedure: PERMANENT PACEMAKER INSERTION;  Surgeon: Thompson Grayer, MD;  Location: [facility] CATH LAB;  Service: Cardiovascular;  Laterality: N/A;  . PROSTATECTOMY  2007    FAMILY HISTORY: Breast cancer.     ADVANCED DIRECTIVES:    HEALTH MAINTENANCE: Social History   Tobacco Use  . Smoking status: Former Smoker    Types: Cigarettes    Last attempt to quit: 02/14/1978    Years since quitting: 39.5  . Smokeless tobacco: Former Systems developer    Quit date: 02/14/1978  Substance Use Topics  . Alcohol use: No  . Drug use: No     Colonoscopy:  PAP:  Bone density:  Lipid panel:  Allergies  Allergen Reactions  . Ace Inhibitors Cough  . Statins Other (See Comments)    CK elevation    Current Outpatient Medications  Medication Sig Dispense Refill  . atenolol (TENORMIN) 50 MG tablet Take 50 mg by mouth daily.  3  . atorvastatin (LIPITOR) 10 MG tablet Take 10 mg by mouth daily.     Marland Kitchen imipramine (TOFRANIL) 25 MG tablet Take 50 mg by mouth 3 (three) times daily.     . Multiple Vitamin (MULTIVITAMIN WITH MINERALS) TABS Take 1 tablet by mouth 2 (two) times daily.    Marland Kitchen  Vitamins/Minerals TABS Take by mouth.     [name] 40 MG capsule TAKE 4 CAPSULES (160MG ) BY MOUTH ONCE DAILY AT SAME TIME. MAY TAKE WITH OR WITHOUT FOOD. 120 capsule 3  . clotrimazole-betamethasone (LOTRISONE) cream APPLY TO TO RASH DAILY AS NEEDED  3   No current facility-administered medications for this visit.     OBJECTIVE: Vitals:   08/01/17 1354  BP: 135/71  Pulse: 79  Resp: 18  Temp: (!) 95.6 F (35.3 C)     Body mass index is 25.87 kg/m.    ECOG FS: 0  General: Well-developed, well-nourished, no acute  distress. Eyes: Pink conjunctiva, anicteric sclera. Lungs: Clear to auscultation bilaterally. Heart: Regular rate and rhythm. No rubs, murmurs, or gallops. Abdomen: Soft, nontender, nondistended. No organomegaly noted, normoactive bowel sounds. Musculoskeletal: No edema, cyanosis, or clubbing.  Right BKA with prosthetic leg noted. Neuro: Alert, answering all questions appropriately. Cranial nerves grossly intact. Skin: No rashes or petechiae noted. Psych: Normal affect.  LAB RESULTS:  Lab Results  Component Value Date   NA 139 08/01/2017   K 5.0 08/01/2017   CL 106 08/01/2017   CO2 26 08/01/2017   GLUCOSE 100 (H) 08/01/2017   BUN 23 (H) 08/01/2017   CREATININE 0.82 08/01/2017   CALCIUM 9.4 08/01/2017   PROT 6.6 08/01/2017   ALBUMIN 4.2 08/01/2017   AST 21 08/01/2017   ALT 14 (L) 08/01/2017   ALKPHOS 55 08/01/2017   BILITOT 0.7 08/01/2017   GFRNONAA >60 08/01/2017   GFRAA >60 08/01/2017    Lab Results  Component Value Date   WBC 7.7 08/01/2017   NEUTROABS 5.2 08/01/2017   HGB 13.8 08/01/2017   HCT 40.5 08/01/2017   MCV 96.6 08/01/2017   PLT 185 08/01/2017   Lab Results  Component Value Date   PSA <0.01 04/04/2016     STUDIES: No results found.  ASSESSMENT: Stage IV prostate cancer with biopsy confirmed metastasis to lung.  PLAN:    1.  Stage IV prostate cancer with biopsy confirmed metastasis to lung: CT scan results from April 21, 2015 reviewed independently and reported continue positive response to therapy as demonstrated by the decrease in size of numerous pulmonary metastasis. CT results from outside emergency room revealed complete resolution, we do not have these results in hand. Patient's PSA continues to be undetectable at less than 0.01.  Lung nodules previously confirmed positive for prostate cancer by biopsy.  We will continue 160 mg Xtandi daily.  We discussed the possibility of temporarily discontinuing treatment and proceed with observation only.   Patient does not wish to pursue this at this time, but would consider it in the near future.  No further imaging is necessary unless there is suspicion of progression of disease.  Return to clinic in 4 months with repeat laboratory work and further evaluation. Patient has moved to outside [place], [place] but states he will keep all of his medical care at [facility]. 2. Weakness/fatigue: Patient does not complain of this today.   3. Hot flashes: Patient does not complain of this today.  Secondary to [place].  I spent a total of 30 minutes face-to-face with the patient of which greater than 50% of the visit was spent in counseling and coordination of care as summarized above.  Patient expressed understanding and was in agreement with this plan. He also understands that He can call clinic at any time with any questions, concerns, or complaints.    Lloyd Huger, MD   08/06/2017  8:31 AM

## 2017-08-01 ENCOUNTER — Inpatient Hospital Stay: Payer: Medicare Other | Attending: Oncology

## 2017-08-01 ENCOUNTER — Telehealth: Payer: Self-pay

## 2017-08-01 ENCOUNTER — Other Ambulatory Visit: Payer: Self-pay

## 2017-08-01 ENCOUNTER — Inpatient Hospital Stay (HOSPITAL_BASED_OUTPATIENT_CLINIC_OR_DEPARTMENT_OTHER): Payer: Medicare Other | Admitting: Oncology

## 2017-08-01 VITALS — BP 135/71 | HR 79 | Temp 95.6°F | Resp 18 | Wt 201.5 lb

## 2017-08-01 DIAGNOSIS — Z79899 Other long term (current) drug therapy: Secondary | ICD-10-CM

## 2017-08-01 DIAGNOSIS — Z87891 Personal history of nicotine dependence: Secondary | ICD-10-CM | POA: Insufficient documentation

## 2017-08-01 DIAGNOSIS — C61 Malignant neoplasm of prostate: Secondary | ICD-10-CM | POA: Insufficient documentation

## 2017-08-01 DIAGNOSIS — Z803 Family history of malignant neoplasm of breast: Secondary | ICD-10-CM

## 2017-08-01 DIAGNOSIS — C7800 Secondary malignant neoplasm of unspecified lung: Secondary | ICD-10-CM

## 2017-08-01 DIAGNOSIS — R232 Flushing: Secondary | ICD-10-CM | POA: Diagnosis not present

## 2017-08-01 DIAGNOSIS — R531 Weakness: Secondary | ICD-10-CM | POA: Diagnosis not present

## 2017-08-01 DIAGNOSIS — I442 Atrioventricular block, complete: Secondary | ICD-10-CM

## 2017-08-01 LAB — CBC WITH DIFFERENTIAL/PLATELET
BASOS ABS: 0.1 10*3/uL (ref 0–0.1)
BASOS PCT: 1 %
EOS PCT: 4 %
Eosinophils Absolute: 0.3 10*3/uL (ref 0–0.7)
HEMATOCRIT: 40.5 % (ref 40.0–52.0)
Hemoglobin: 13.8 g/dL (ref 13.0–18.0)
LYMPHS PCT: 18 %
Lymphs Abs: 1.3 10*3/uL (ref 1.0–3.6)
MCH: 33 pg (ref 26.0–34.0)
MCHC: 34.2 g/dL (ref 32.0–36.0)
MCV: 96.6 fL (ref 80.0–100.0)
MONO ABS: 0.8 10*3/uL (ref 0.2–1.0)
Monocytes Relative: 10 %
NEUTROS ABS: 5.2 10*3/uL (ref 1.4–6.5)
Neutrophils Relative %: 67 %
PLATELETS: 185 10*3/uL (ref 150–440)
RBC: 4.2 MIL/uL — AB (ref 4.40–5.90)
RDW: 12.9 % (ref 11.5–14.5)
WBC: 7.7 10*3/uL (ref 3.8–10.6)

## 2017-08-01 LAB — COMPREHENSIVE METABOLIC PANEL
ALK PHOS: 55 U/L (ref 38–126)
ALT: 14 U/L — ABNORMAL LOW (ref 17–63)
ANION GAP: 7 (ref 5–15)
AST: 21 U/L (ref 15–41)
Albumin: 4.2 g/dL (ref 3.5–5.0)
BILIRUBIN TOTAL: 0.7 mg/dL (ref 0.3–1.2)
BUN: 23 mg/dL — ABNORMAL HIGH (ref 6–20)
CALCIUM: 9.4 mg/dL (ref 8.9–10.3)
CO2: 26 mmol/L (ref 22–32)
Chloride: 106 mmol/L (ref 101–111)
Creatinine, Ser: 0.82 mg/dL (ref 0.61–1.24)
GFR calc Af Amer: >60 mL/min (ref >60)
GFR calc non Af Amer: >60 mL/min (ref >60)
Glucose, Bld: 100 mg/dL — ABNORMAL HIGH (ref 65–99)
POTASSIUM: 5 mmol/L (ref 3.5–5.1)
Sodium: 139 mmol/L (ref 135–145)
TOTAL PROTEIN: 6.6 g/dL (ref 6.5–8.1)

## 2017-08-01 LAB — PSA: Prostatic Specific Antigen: <0.01 ng/mL (ref 0.00–4.00)

## 2017-08-01 NOTE — Telephone Encounter (Signed)
Oral Chemotherapy Pharmacy Student Encounter  Follow-Up Form  Saw patient in clinic today to follow up regarding patient's oral chemotherapy medication: Xtandi (enzalutamide)  Original Start date of oral chemotherapy: 05/2014  Pt reports occasionally missing doses.  Missed dose(s) attributed to: Patient unsure if he has taken his Xtandi earlier in the day and does not want to risk doubling up. Reviewed plan for missed doses.   Pt reports the following side effects: Constipation   New medications?: N/A  Other Issues: N/A  Patient knows to call the office with questions or concerns. Oral Oncology Clinic will continue to follow.  Willia Craze, Pharmacy Student   08/01/2017 2:26 PM

## 2017-08-01 NOTE — Progress Notes (Signed)
Here for follow up ( in slowed halting manner ) stated " Im doing well-I guess " ( lives w wife and daughter per pt- )

## 2017-08-01 NOTE — Telephone Encounter (Signed)
[place]  neous encounter

## 2017-08-28 MED FILL — XTANDI 40 MG CAPSULE: 40 | 30 days supply | Qty: 120 | Fill #2

## 2017-09-25 MED FILL — XTANDI 40 MG CAPSULE: 40 | 30 days supply | Qty: 120 | Fill #3

## 2017-10-17 ENCOUNTER — Other Ambulatory Visit: Payer: Self-pay | Admitting: Oncology

## 2017-10-17 DIAGNOSIS — C61 Malignant neoplasm of prostate: Secondary | ICD-10-CM

## 2017-10-25 MED FILL — XTANDI 40 MG CAPSULE: 40 | 30 days supply | Qty: 120 | Fill #0

## 2017-11-24 ENCOUNTER — Other Ambulatory Visit: Payer: Self-pay | Admitting: *Deleted

## 2017-11-24 DIAGNOSIS — C61 Malignant neoplasm of prostate: Secondary | ICD-10-CM

## 2017-11-26 NOTE — Progress Notes (Signed)
[place]  [phone] [phone]  ID: [name] OB: 01/24/1931  MR#: [id number]  CXK#:[id number]  Patient Care Team: Albina Billet, MD as PCP - General (Unknown Physician Specialty) Christene Lye, MD (General Surgery)  CHIEF COMPLAINT: Stage IV prostate cancer with biopsy confirmed metastasis to lung.  INTERVAL HISTORY: Patient returns to clinic today for repeat laboratory can routine 59-month evaluation.  He continues to tolerate Xtandi well without significant side effects, but has noted a possible increase in hot flashes.  He has no neurologic complaints, but continues to complain of declining memory.  He denies any recent fevers or illnesses.  He denies any chest pain or shortness of breath.  He has a good appetite and denies weight loss.  He denies any nausea, vomiting, constipation, or diarrhea.  He has no urinary complaints.  Patient offers no further specific complaints today.   REVIEW OF SYSTEMS:   Review of Systems  Constitutional: Positive for diaphoresis. Negative for fever, malaise/fatigue and weight loss.  Respiratory: Negative.  Negative for cough and shortness of breath.   Cardiovascular: Negative.  Negative for chest pain and leg swelling.  Gastrointestinal: Negative.  Negative for abdominal pain and constipation.  Genitourinary: Negative.  Negative for dysuria and frequency.  Musculoskeletal: Negative.  Negative for back pain.  Skin: Negative.  Negative for rash.  Neurological: Negative.  Negative for dizziness, sensory change, focal weakness, weakness and headaches.  Psychiatric/Behavioral: Positive for memory loss. The patient is not nervous/anxious.     As per HPI. Otherwise, a complete review of systems is negative.  PAST MEDICAL HISTORY: Past Medical History:  Diagnosis Date  . Atrioventricular block, complete 12/21/2011  . Cancer [facility])    prostate 2007  . Headache(784.0)    migraines w/o HA; [place]  .  Hypertension   . Multiple lung nodules 01/2012  . Pacemaker-St.Jude 12/22/2011    PAST SURGICAL HISTORY: Past Surgical History:  Procedure Laterality Date  . CHOLECYSTECTOMY  04/04/13   open  . HERNIA REPAIR     inguinal hernia  . INSERT / REPLACE / REMOVE PACEMAKER  12/2011   St. Jude  . JOINT REPLACEMENT     both R & L replacement;  then R AKA  . LEG AMPUTATION ABOVE KNEE  2009   R leg  . PERMANENT PACEMAKER INSERTION N/A 12/21/2011   Procedure: PERMANENT PACEMAKER INSERTION;  Surgeon: Thompson Grayer, MD;  Location: [facility] CATH LAB;  Service: Cardiovascular;  Laterality: N/A;  . PROSTATECTOMY  2007    FAMILY HISTORY: Breast cancer.     ADVANCED DIRECTIVES:    HEALTH MAINTENANCE: Social History   Tobacco Use  . Smoking status: Former Smoker    Types: Cigarettes    Last attempt to quit: 02/14/1978    Years since quitting: 39.8  . Smokeless tobacco: Former Systems developer    Quit date: 02/14/1978  Substance Use Topics  . Alcohol use: No  . Drug use: No     Colonoscopy:  PAP:  Bone density:  Lipid panel:  Allergies  Allergen Reactions  . Ace Inhibitors Cough  . Statins Other (See Comments)    CK elevation    Current Outpatient Medications  Medication Sig Dispense Refill  . atenolol (TENORMIN) 50 MG tablet Take 50 mg by mouth daily.  3  . atorvastatin (LIPITOR) 10 MG tablet Take 10 mg by mouth daily.     . clotrimazole-betamethasone (LOTRISONE) cream APPLY TO TO RASH DAILY AS NEEDED  3  . imipramine (  TOFRANIL) 25 MG tablet Take 50 mg by mouth 3 (three) times daily.     . Multiple Vitamin (MULTIVITAMIN WITH MINERALS) TABS Take 1 tablet by mouth 2 (two) times daily.    . Vitamins/Minerals TABS Take by mouth.     [name] 40 MG capsule TAKE 4 CAPSULES (160MG ) BY MOUTH ONCE DAILY AT SAME TIME. MAY TAKE WITH OR WITHOUT FOOD. 120 capsule 3   No current facility-administered medications for this visit.     OBJECTIVE: Vitals:   11/27/17 1412  BP: 125/68  Pulse: 85  Resp: 18    Temp: 97.8 F (36.6 C)     Body mass index is 25.37 kg/m.    ECOG FS: 0  General: Well-developed, well-nourished, no acute distress. Eyes: Pink conjunctiva, anicteric sclera. HEENT: Normocephalic, moist mucous membranes, clear oropharnyx. Lungs: Clear to auscultation bilaterally. Heart: Regular rate and rhythm. No rubs, murmurs, or gallops. Abdomen: Soft, nontender, nondistended. No organomegaly noted, normoactive bowel sounds. Musculoskeletal: No edema, cyanosis, or clubbing. Neuro: Alert, answering all questions appropriately. Cranial nerves grossly intact. Skin: No rashes or petechiae noted. Psych: Normal affect.   LAB RESULTS:  Lab Results  Component Value Date   NA 142 11/27/2017   K 4.3 11/27/2017   CL 109 11/27/2017   CO2 27 11/27/2017   GLUCOSE 125 (H) 11/27/2017   BUN 26 (H) 11/27/2017   CREATININE 0.92 11/27/2017   CALCIUM 9.7 11/27/2017   PROT 6.7 11/27/2017   ALBUMIN 4.3 11/27/2017   AST 22 11/27/2017   ALT 15 11/27/2017   ALKPHOS 56 11/27/2017   BILITOT 0.8 11/27/2017   GFRNONAA >60 11/27/2017   GFRAA >60 11/27/2017    Lab Results  Component Value Date   WBC 7.1 11/27/2017   NEUTROABS 4.7 11/27/2017   HGB 14.1 11/27/2017   HCT 42.6 11/27/2017   MCV 96.6 11/27/2017   PLT 200 11/27/2017   Lab Results  Component Value Date   PSA <0.01 04/04/2016     STUDIES: No results found.  ASSESSMENT: Stage IV prostate cancer with biopsy confirmed metastasis to lung.  PLAN:    1.  Stage IV prostate cancer with biopsy confirmed metastasis to lung: CT scan results from April 21, 2015 reviewed independently with positive response to therapy as demonstrated by the decrease in size of numerous pulmonary metastasis. CT results from outside emergency room recently revealed complete resolution, but we do not have these results in hand. Patient's PSA continues to be undetectable at less than 0.01, today's result is pending.  Continue 160 mg Xtandi daily.  Once again,  we discussed the possibility of temporarily discontinuing treatment and proceed with observation only.  Patient does not wish to pursue this at this time, but would consider it in the near future.  No further imaging is necessary unless there is suspicion of progression of disease.  Return to clinic in 4 months with repeat laboratory work and routine evaluation.  Patient has moved to outside [place], [place] but states he will keep all of his medical care at [facility]. 2. Weakness/fatigue: Patient does not complain of this today.   3. Hot flashes: Patient reports there is slightly worse.  We will continue treatment as prescribed as above.  Likely secondary to [facility].  I spent a total of 30 minutes face-to-face with the patient of which greater than 50% of the visit was spent in counseling and coordination of care as detailed above.  Patient expressed understanding and was in agreement with this plan. He  also understands that He can call clinic at any time with any questions, concerns, or complaints.    Lloyd Huger, MD   11/27/2017 4:07 PM

## 2017-11-27 ENCOUNTER — Inpatient Hospital Stay (HOSPITAL_BASED_OUTPATIENT_CLINIC_OR_DEPARTMENT_OTHER): Payer: Medicare Other | Admitting: Oncology

## 2017-11-27 ENCOUNTER — Inpatient Hospital Stay: Payer: Medicare Other | Attending: Oncology

## 2017-11-27 ENCOUNTER — Encounter: Payer: Self-pay | Admitting: Oncology

## 2017-11-27 VITALS — BP 125/68 | HR 85 | Temp 97.8°F | Resp 18 | Wt 197.6 lb

## 2017-11-27 DIAGNOSIS — Z803 Family history of malignant neoplasm of breast: Secondary | ICD-10-CM | POA: Insufficient documentation

## 2017-11-27 DIAGNOSIS — Z79899 Other long term (current) drug therapy: Secondary | ICD-10-CM | POA: Diagnosis not present

## 2017-11-27 DIAGNOSIS — I442 Atrioventricular block, complete: Secondary | ICD-10-CM | POA: Diagnosis not present

## 2017-11-27 DIAGNOSIS — C61 Malignant neoplasm of prostate: Secondary | ICD-10-CM | POA: Diagnosis present

## 2017-11-27 DIAGNOSIS — Z87891 Personal history of nicotine dependence: Secondary | ICD-10-CM | POA: Diagnosis not present

## 2017-11-27 DIAGNOSIS — R5383 Other fatigue: Secondary | ICD-10-CM | POA: Insufficient documentation

## 2017-11-27 DIAGNOSIS — I10 Essential (primary) hypertension: Secondary | ICD-10-CM

## 2017-11-27 DIAGNOSIS — C7800 Secondary malignant neoplasm of unspecified lung: Secondary | ICD-10-CM | POA: Diagnosis not present

## 2017-11-27 DIAGNOSIS — R413 Other amnesia: Secondary | ICD-10-CM | POA: Insufficient documentation

## 2017-11-27 DIAGNOSIS — R531 Weakness: Secondary | ICD-10-CM | POA: Insufficient documentation

## 2017-11-27 DIAGNOSIS — R232 Flushing: Secondary | ICD-10-CM

## 2017-11-27 DIAGNOSIS — R61 Generalized hyperhidrosis: Secondary | ICD-10-CM | POA: Diagnosis not present

## 2017-11-27 LAB — CBC WITH DIFFERENTIAL/PLATELET
Abs Immature Granulocytes: 0.03 10*3/uL (ref 0.00–0.07)
BASOS ABS: 0.1 10*3/uL (ref 0.0–0.1)
BASOS PCT: 1 %
EOS ABS: 0.2 10*3/uL (ref 0.0–0.5)
EOS PCT: 2 %
HCT: 42.6 % (ref 39.0–52.0)
Hemoglobin: 14.1 g/dL (ref 13.0–17.0)
Immature Granulocytes: 0 %
Lymphocytes Relative: 21 %
Lymphs Abs: 1.5 10*3/uL (ref 0.7–4.0)
MCH: 32 pg (ref 26.0–34.0)
MCHC: 33.1 g/dL (ref 30.0–36.0)
MCV: 96.6 fL (ref 80.0–100.0)
Monocytes Absolute: 0.7 10*3/uL (ref 0.1–1.0)
Monocytes Relative: 9 %
NEUTROS PCT: 67 %
NRBC: 0 % (ref 0.0–0.2)
Neutro Abs: 4.7 10*3/uL (ref 1.7–7.7)
PLATELETS: 200 10*3/uL (ref 150–400)
RBC: 4.41 MIL/uL (ref 4.22–5.81)
RDW: 12 % (ref 11.5–15.5)
WBC: 7.1 10*3/uL (ref 4.0–10.5)

## 2017-11-27 LAB — PSA

## 2017-11-27 LAB — COMPREHENSIVE METABOLIC PANEL
ALT: 15 U/L (ref 0–44)
ANION GAP: 6 (ref 5–15)
AST: 22 U/L (ref 15–41)
Albumin: 4.3 g/dL (ref 3.5–5.0)
Alkaline Phosphatase: 56 U/L (ref 38–126)
BILIRUBIN TOTAL: 0.8 mg/dL (ref 0.3–1.2)
BUN: 26 mg/dL — ABNORMAL HIGH (ref 8–23)
CALCIUM: 9.7 mg/dL (ref 8.9–10.3)
CO2: 27 mmol/L (ref 22–32)
Chloride: 109 mmol/L (ref 98–111)
Creatinine, Ser: 0.92 mg/dL (ref 0.61–1.24)
Glucose, Bld: 125 mg/dL — ABNORMAL HIGH (ref 70–99)
Potassium: 4.3 mmol/L (ref 3.5–5.1)
Sodium: 142 mmol/L (ref 135–145)
TOTAL PROTEIN: 6.7 g/dL (ref 6.5–8.1)

## 2017-11-27 MED FILL — XTANDI 40 MG CAPSULE: 40 | 30 days supply | Qty: 120 | Fill #1

## 2017-11-27 NOTE — Progress Notes (Signed)
Pt in for follow up, verbalized no concerns.

## 2017-12-28 MED FILL — XTANDI 40 MG CAPSULE: 40 | 30 days supply | Qty: 120 | Fill #2

## 2018-01-22 MED FILL — XTANDI 40 MG CAPSULE: 40 | 30 days supply | Qty: 120 | Fill #3

## 2018-02-04 IMAGING — CT CT CHEST W/ CM
1 of 2 series · 14 of 31 positions shown, 18 images · IV contrast (omnipaque)
Comparison: Multiple priors, most recently chest CT 01/16/2015.

CLINICAL DATA: 84-year-old male with history of prostate cancer
diagnosed 7 years ago with metastatic disease to the lungs noted 2
years ago. Currently on chemotherapy for lung metastasis.

EXAM:
CT CHEST WITH CONTRAST
TECHNIQUE: Multidetector CT imaging of the chest was performed during
intravenous contrast administration.
CONTRAST:  75mL OMNIPAQUE IOHEXOL 300 MG/ML  SOLN

[Series 2: routine chest with · axial · 0.68mm/px · z∈[-567,-272]mm · 14 of 71 slices shown, 18 images]
[im 6/71  mediastinal]
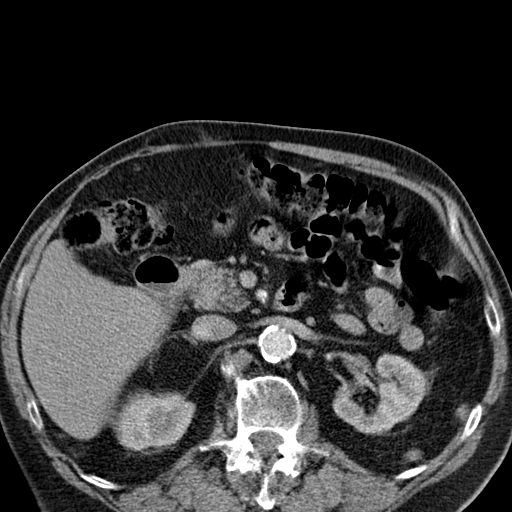
[im 6/71  lung]
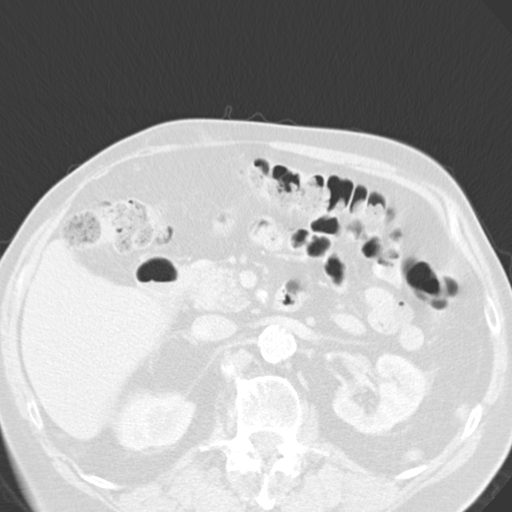
[im 11/71  lung]
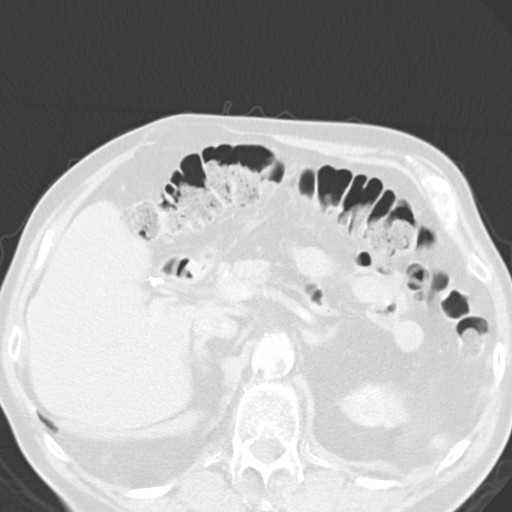
[im 17/71  lung]
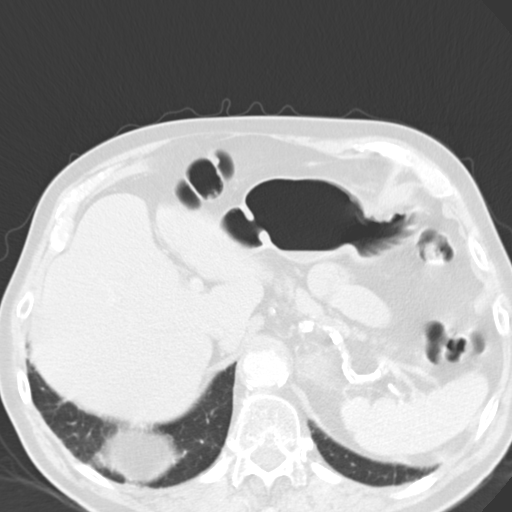
[im 22/71  lung]
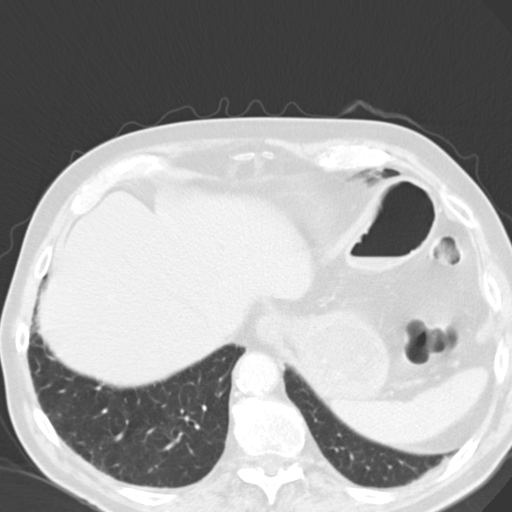
[im 27/71  mediastinal]
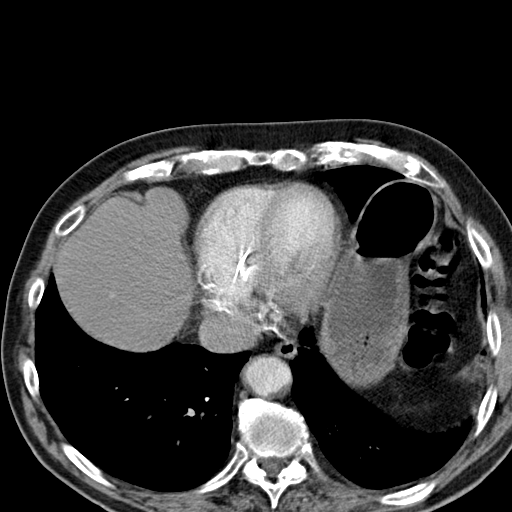
[im 27/71  lung]
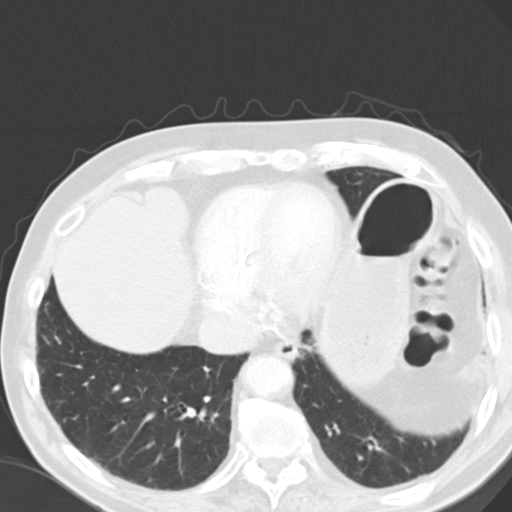
[im 33/71  lung]
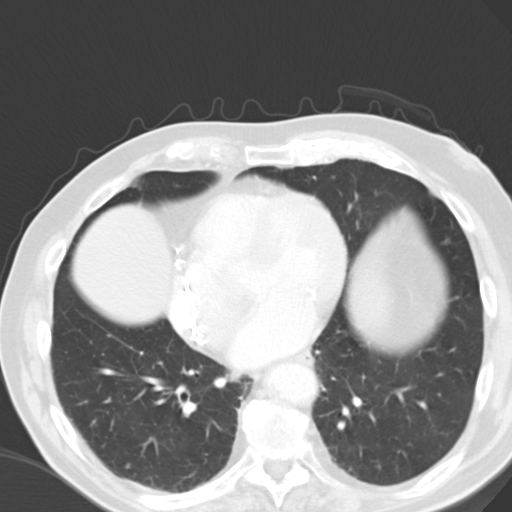
[im 34/71  lung]
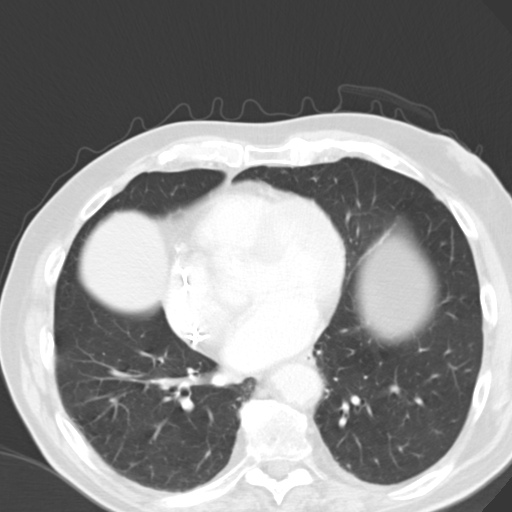
[im 36/71  lung]
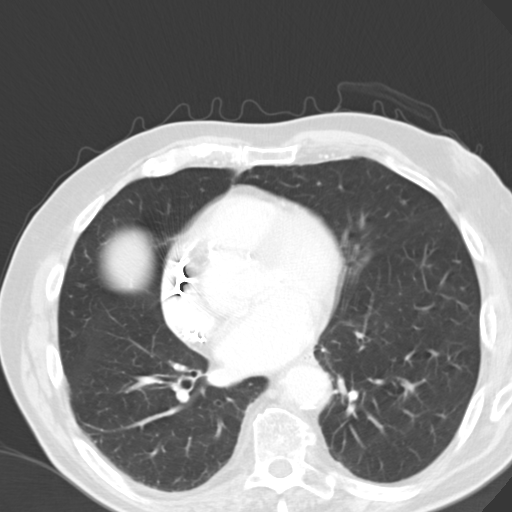
[im 38/71  mediastinal]
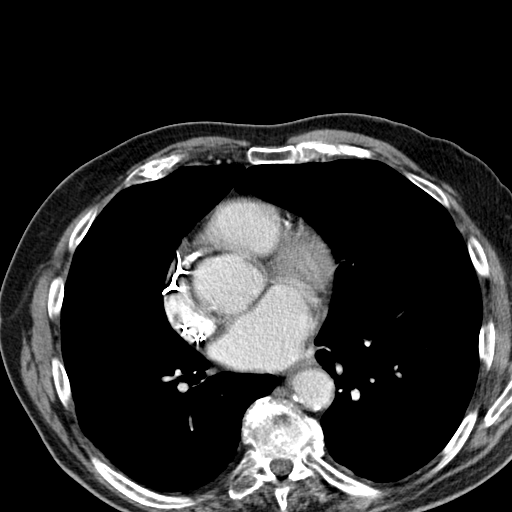
[im 38/71  lung]
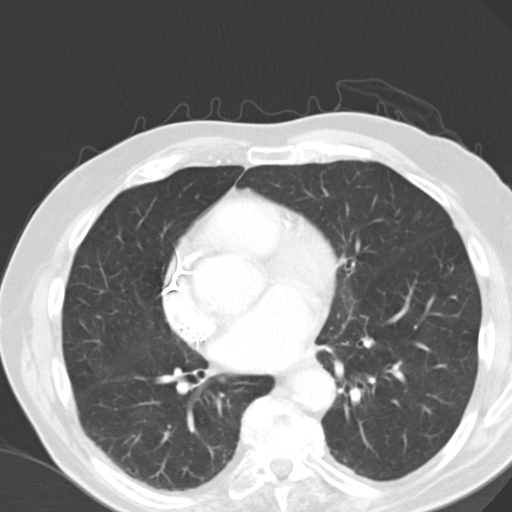
[im 44/71  lung]
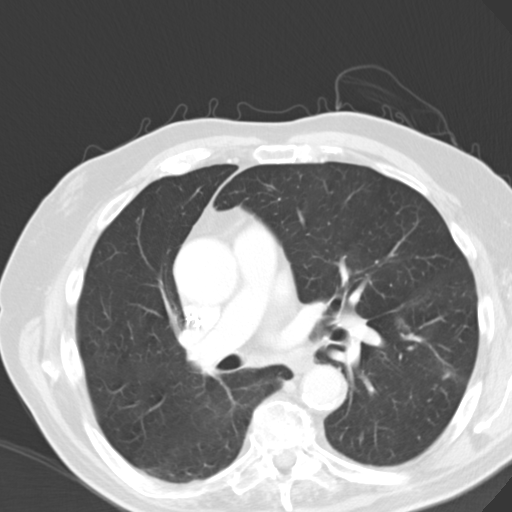
[im 49/71  lung]
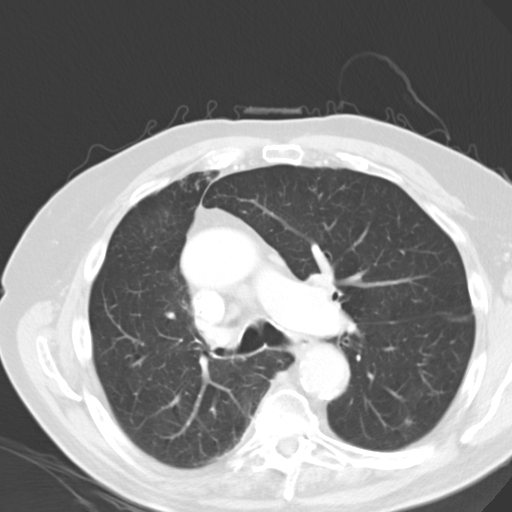
[im 54/71  lung]
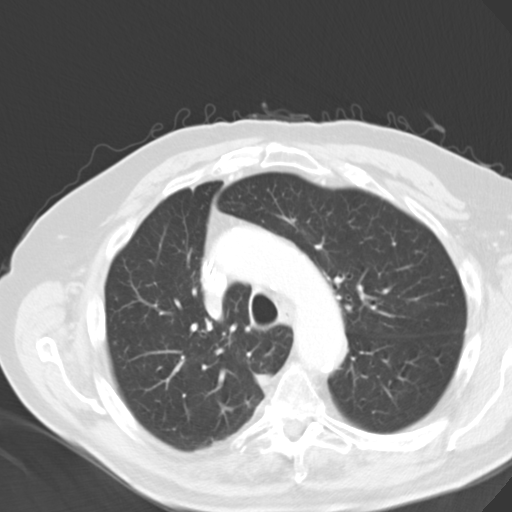
[im 60/71  mediastinal]
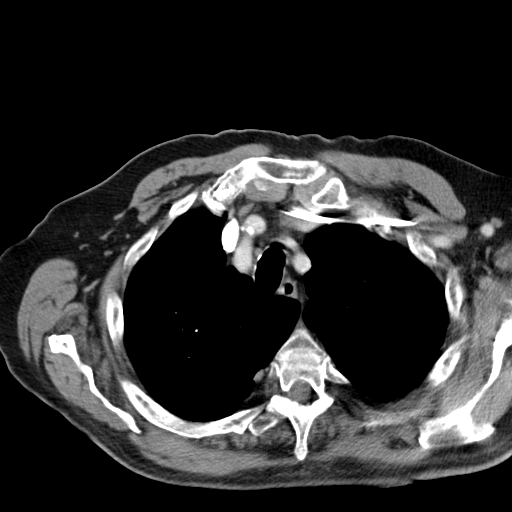
[im 60/71  lung]
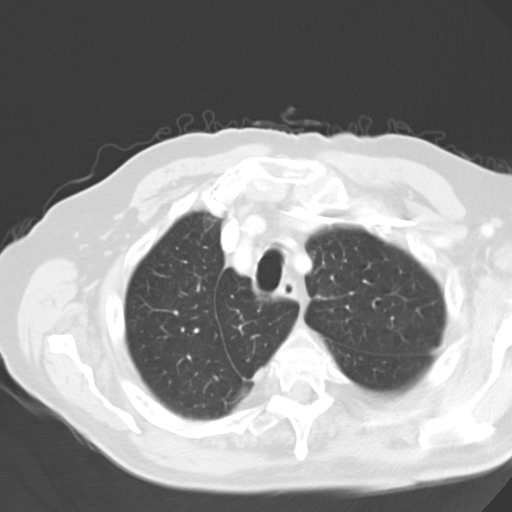
[im 65/71  lung]
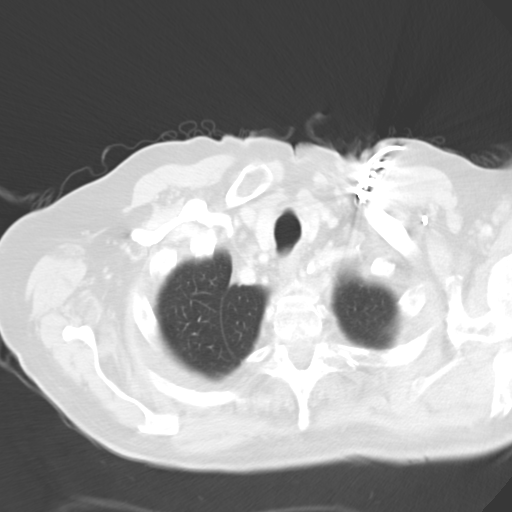

[14 of 31 positions shown; findings below may reference images not displayed]

FINDINGS: Mediastinum/Lymph Nodes: Heart size is normal. There is no
significant pericardial fluid, thickening or pericardial
calcification. There is atherosclerosis of the thoracic aorta, the
great vessels of the mediastinum and the coronary arteries,
including calcified atherosclerotic plaque in the left anterior
descending, left circumflex and right coronary arteries.
Calcifications of the aortic valve, mitral valve and mitral annulus.
No pathologically enlarged mediastinal or hilar lymph nodes.
Esophagus is unremarkable in appearance. No axillary
lymphadenopathy. Left-sided pacemaker in position with lead tips
terminating in the right atrial appendage and right ventricular
apex.

Lungs/Pleura: Small right-sided Bochdalek's hernia. As with the
prior examination, the multiple treated pulmonary metastases all
appear stable to decreased in size compared to prior examinations.
The best example of this is the lesion in the left upper lobe apex
which continues to decrease in size, currently measuring 13 x 4 mm
(previously 17 x 8 mm). No new suspicious appearing pulmonary
nodules or masses are otherwise noted. Small focus of
peribronchovascular ground-glass attenuation in the anterior aspect
of the right upper lobe (image 20 of series 3), new compared to the
prior examination, favored to be infectious or inflammatory in
etiology. Azygos lobe (normal anatomical variant) incidentally
noted. No pleural effusions.

Upper Abdomen: Small ventral hernia containing only omental fat in
the epigastric region slightly to the right of midline. Status post
cholecystectomy. Extensive atherosclerosis.

Musculoskeletal/Soft Tissues: There are no aggressive appearing
lytic or blastic lesions noted in the visualized portions of the
skeleton.
IMPRESSION: 1. Continued positive response to therapy, as demonstrated by stable
to decreased size of numerous previously noted pulmonary metastasis.
No new metastatic lesions are identified on today's examination.
2. Small focus of peribronchovascular ground-glass attenuation in
the anterior aspect of the right upper lobe, new compared to the
prior study, presumably infectious or inflammatory in etiology.
3.  Atherosclerosis, including three-vessel coronary artery disease.
4. Additional incidental findings, similar to prior examinations, as
above.

## 2018-02-12 ENCOUNTER — Other Ambulatory Visit: Payer: Self-pay | Admitting: Oncology

## 2018-02-12 DIAGNOSIS — C61 Malignant neoplasm of prostate: Secondary | ICD-10-CM

## 2018-02-12 NOTE — Telephone Encounter (Signed)
)   Ref Range & Units 2mo ago (11/27/17) 6mo ago (08/01/17) 10mo ago (04/03/17) 1yr ago (11/28/16) 1yr ago (08/01/16) 1yr ago (04/04/16) 2yr ago (12/02/15)  WBC 4.0 - 10.5 K/uL 7.1  7.7 R 6.9 R 6.7 R 6.7 R 6.0 R 6.0 R  RBC 4.22 - 5.81 MIL/uL 4.41  4.20Low  R 4.34Low  R 4.47 R 4.12Low  R 4.34Low  R 4.29Low  R  Hemoglobin 13.0 - 17.0 g/dL 14.1  13.8 R 14.3 R 14.6 R 13.6 R 14.0 R 14.3 R  HCT 39.0 - 52.0 % 42.6  40.5 R 42.1 R 43.5 R 39.4Low  R 41.0 R 41.1 R  MCV 80.0 - 100.0 fL 96.6  96.6  97.0  97.4  95.8  94.4  95.9   MCH 26.0 - 34.0 pg 32.0  33.0  32.9  32.6  33.0  32.3  33.3   MCHC 30.0 - 36.0 g/dL 33.1  34.2 R 33.9 R 33.5 R 34.5 R 34.2 R 34.7 R  RDW 11.5 - 15.5 % 12.0  12.9 R 12.8 R 13.3 R 12.9 R 13.9 R 12.7 R  Platelets 150 - 400 K/uL 200  185 R 178 R 178 R 179 R 193 R 180 R  nRBC 0.0 - 0.2 % 0.0         Neutrophils Relative % % 67  67  62  63  64  68  66   Neutro Abs 1.7 - 7.7 K/uL 4.7  5.2 R 4.3 R 4.3 R 4.2 R 4.1 R 3.9 R  Lymphocytes Relative % 21  18  24  21  20  19  21   Lymphs Abs 0.7 - 4.0 K/uL 1.5  1.3 R 1.7 R 1.4 R 1.3 R 1.1 R 1.2 R  Monocytes Relative % 9  10  10  12  10  9  9   Monocytes Absolute 0.1 - 1.0 K/uL 0.7  0.8 R 0.7 R 0.8 R 0.7 R 0.5 R 0.5 R  Eosinophils Relative % 2  4  3  3  5  3  3   Eosinophils Absolute 0.0 - 0.5 K/uL 0.2  0.3 R 0.2 R 0.2 R 0.4 R 0.2 R 0.2 R  Basophils Relative % 1  1  1  1  1  1  1   Basophils Absolute 0.0 - 0.1 K/uL 0.1  0.1 R, CM 0.1 R, CM 0.1 R 0.1 R 0.1 R 0.1 R  Immature Granulocytes % 0         Abs Immature Granulocytes 0.00 - 0.07 K/uL 0.03         Comment: Performed at [facility], [address] [place], [place] [address]  Resulting Agency  [facility]      Specimen Collected: 11/27/17 13:52  Last Resulted: 11/27/17 14:15        )   Ref Range & Units 2mo ago (11/27/17) 6mo ago (08/01/17) 10mo ago (04/03/17) 1yr ago (11/28/16) 1yr ago (08/01/16)   Prostatic Specific Antigen 0.00 - 4.00 ng/mL <0.01  <0.01 CM <0.01 CM <0.01 CM 1.50 CM  Comment: Performed at [facility], [address] [place], [place] [address]  Resulting Agency  [facility]      Specimen Collected: 11/27/17 13:52       )     Ref Range & Units 59moago (11/27/17) 659mogo (08/01/17) 1064moo (04/03/17) 71yr369yr (11/28/16) 71yr 88yr(08/01/16) 71yr a4yr2/19/18) 48yr ag469yr0/18/17)  Sodium 135 - 145 mmol/L 142  139  138  140  138  140  137   Potassium 3.5 - 5.1 mmol/L 4.3  5.0  4.5  4.4  4.4  4.7  4.2   Chloride 98 - 111 mmol/L 109  106 R 103 R 104 R 102 R 107 R 102 R  CO2 22 - 32 mmol/L _0 Glucose, Bld 70 - 99 mg/dL 125High   100High  R 93 R 85 R 103High  R 91 R 137High  R  BUN 8 - 23 mg/dL 26High   23High  R 22High  R 18 R 24High  R 16 R 18 R  Creatinine, Ser 0.61 - 1.24 mg/dL 0.92  0.82  0.98  0.82  0.81  0.79  0.69   Calcium 8.9 - 10.3 mg/dL 9.7  9.4  9.5  9.4  9.1  9.3  9.1   Total Protein 6.5 - 8.1 g/dL 6.7  6.6  6.8  6.8  6.6  6.8  6.5   Albumin 3.5 - 5.0 g/dL 4.3  4.2  4.2  4.2  4.0  4.0  4.1   AST 15 - 41 U/L _1 ALT 0 - 44 U/L 15  14Low  R 14Low  R 14Low  R 13Low  R 15Low  R 14Low  R  Alkaline Phosphatase 38 - 126 U/L 56  55  60  58  62  59  54   Total Bilirubin 0.3 - 1.2 mg/dL 0.8  0.7  0.7  0.8  0.7  0.9  0.9   GFR calc non Af Amer >60 mL/min >60  >60  >60  >60  >60  >60  >60   GFR calc Af Amer >60 mL/min >60  >60 CM >60 CM >60 CM >60 CM >60 CM >60 CM  Comment: (NOTE)  The eGFR has been calculated using the CKD EPI equation.  This calculation has not been validated in all clinical situations.  eGFR's persistently <60 mL/min signify possible Chronic Kidney  Disease.   Anion gap 5 - _2 CM 8 CM _3 Comment: Performed [facility]  [address]ting Agency  [facility]      Specimen Collected: 11/27/17 13:52  Last Resulted: 11/27/17 14:32

## 2018-02-19 ENCOUNTER — Telehealth: Payer: Self-pay | Admitting: Pharmacy Technician

## 2018-02-19 NOTE — Telephone Encounter (Signed)
Oral Oncology Patient Advocate Encounter   Was successful in securing patient an $69 grant from Patient [place] [facility]) to provide copayment coverage for [employer]s.  This will keep the out of pocket expense at $0.     I have spoken with the patient.    The billing information is as follows and has been shared with [place].   Member ID: [id number] Group ID: [id number] RxBin: [id number] Dates of Eligibility: 02/16/2018 through 02/16/2019  [place] Patient [place] Phone [phone] Fax [phone] 02/19/2018 1:11 PM

## 2018-02-20 ENCOUNTER — Telehealth: Payer: Self-pay | Admitting: Pharmacy Technician

## 2018-02-20 NOTE — Telephone Encounter (Signed)
Oral Oncology Patient Advocate Encounter   Was successful in securing patient a $7500 grant from [employer] to provide copayment coverage for Xtandi.  This will keep the out of pocket expense at $0.    The billing information is as follows and has been shared with [place].   Member ID: [id number] Group ID: [id number] RxBin: [id number] PCN: [id number] Dates of Eligibility: 02/15/2018 through 02/16/2019  [place] Patient [place] Phone [phone] Fax [phone] 02/20/2018 10:39 AM

## 2018-02-21 MED FILL — XTANDI 40 MG CAPSULE: 40 | 30 days supply | Qty: 120 | Fill #0

## 2018-03-21 MED FILL — XTANDI 40 MG CAPSULE: 40 | 30 days supply | Qty: 120 | Fill #1

## 2018-03-27 ENCOUNTER — Telehealth: Payer: Self-pay | Admitting: *Deleted

## 2018-03-27 NOTE — Telephone Encounter (Signed)
Daughter Magda Paganini called to report that patient expired on Friday 04/07/2018.

## 2018-04-02 ENCOUNTER — Other Ambulatory Visit: Payer: Medicare Other

## 2018-04-02 ENCOUNTER — Ambulatory Visit: Payer: Medicare Other | Admitting: Oncology

## 2018-04-15 DEATH — deceased
